# Patient Record
Sex: Male | Born: 1980 | Race: Black or African American | Hispanic: No | Marital: Single | Smoking: Former smoker
Health system: Southern US, Community
[De-identification: ages and names within clinical notes are randomized; demographics above are authoritative.]

## PROBLEM LIST (undated history)

## (undated) DIAGNOSIS — F429 Obsessive-compulsive disorder, unspecified: Secondary | ICD-10-CM

## (undated) DIAGNOSIS — F1210 Cannabis abuse, uncomplicated: Secondary | ICD-10-CM

## (undated) DIAGNOSIS — F1510 Other stimulant abuse, uncomplicated: Secondary | ICD-10-CM

## (undated) DIAGNOSIS — F424 Excoriation (skin-picking) disorder: Secondary | ICD-10-CM

## (undated) DIAGNOSIS — E119 Type 2 diabetes mellitus without complications: Secondary | ICD-10-CM

## (undated) DIAGNOSIS — F1410 Cocaine abuse, uncomplicated: Secondary | ICD-10-CM

## (undated) DIAGNOSIS — I10 Essential (primary) hypertension: Secondary | ICD-10-CM

## (undated) DIAGNOSIS — L981 Factitial dermatitis: Secondary | ICD-10-CM

## (undated) HISTORY — PX: OTHER SURGICAL HISTORY: SHX169

---

## 2013-01-04 ENCOUNTER — Encounter (HOSPITAL_BASED_OUTPATIENT_CLINIC_OR_DEPARTMENT_OTHER): Payer: Self-pay | Admitting: *Deleted

## 2013-01-04 DIAGNOSIS — A6000 Herpesviral infection of urogenital system, unspecified: Secondary | ICD-10-CM | POA: Insufficient documentation

## 2013-01-04 DIAGNOSIS — S3120XA Unspecified open wound of penis, initial encounter: Secondary | ICD-10-CM | POA: Insufficient documentation

## 2013-01-04 DIAGNOSIS — W503XXA Accidental bite by another person, initial encounter: Secondary | ICD-10-CM | POA: Insufficient documentation

## 2013-01-04 DIAGNOSIS — Y939 Activity, unspecified: Secondary | ICD-10-CM | POA: Insufficient documentation

## 2013-01-04 DIAGNOSIS — Z87891 Personal history of nicotine dependence: Secondary | ICD-10-CM | POA: Insufficient documentation

## 2013-01-04 DIAGNOSIS — Y929 Unspecified place or not applicable: Secondary | ICD-10-CM | POA: Insufficient documentation

## 2013-01-04 DIAGNOSIS — L089 Local infection of the skin and subcutaneous tissue, unspecified: Secondary | ICD-10-CM | POA: Insufficient documentation
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
## 2013-01-05 ENCOUNTER — Encounter (HOSPITAL_BASED_OUTPATIENT_CLINIC_OR_DEPARTMENT_OTHER): Payer: Self-pay | Admitting: Emergency Medicine

## 2013-01-05 ENCOUNTER — Emergency Department (HOSPITAL_BASED_OUTPATIENT_CLINIC_OR_DEPARTMENT_OTHER)
Admission: EM | Admit: 2013-01-05 | Discharge: 2013-01-05 | Disposition: A | Discharge status: Home / Self Care | Payer: BC Managed Care – PPO | Attending: Emergency Medicine | Admitting: Emergency Medicine

## 2013-01-05 DIAGNOSIS — A6000 Herpesviral infection of urogenital system, unspecified: Secondary | ICD-10-CM

## 2013-01-05 DIAGNOSIS — W503XXA Accidental bite by another person, initial encounter: Secondary | ICD-10-CM

## 2013-01-05 DIAGNOSIS — L089 Local infection of the skin and subcutaneous tissue, unspecified: Secondary | ICD-10-CM

## 2013-01-05 MED ORDER — AMOXICILLIN-POT CLAVULANATE 875-125 MG PO TABS: 1.0000 | ORAL_TABLET | Freq: Two times a day (BID) | ORAL | Status: DC

## 2013-01-05 MED ORDER — OXYCODONE-ACETAMINOPHEN 5-325 MG PO TABS: 1.0000 | ORAL_TABLET | Freq: Four times a day (QID) | ORAL | Status: DC | PRN

## 2013-01-05 MED ORDER — AMOXICILLIN-POT CLAVULANATE 875-125 MG PO TABS
1.0000 | ORAL_TABLET | Freq: Once | ORAL | Status: AC
  Administered 2013-01-05: 1 via ORAL
  Filled 2013-01-05: qty 1

## 2013-01-05 MED ORDER — VALACYCLOVIR HCL 1 G PO TABS: 1000.0000 mg | ORAL_TABLET | Freq: Two times a day (BID) | ORAL | Status: AC
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
## 2015-01-15 ENCOUNTER — Encounter (HOSPITAL_BASED_OUTPATIENT_CLINIC_OR_DEPARTMENT_OTHER): Payer: Self-pay | Admitting: *Deleted

## 2015-01-15 ENCOUNTER — Emergency Department (HOSPITAL_BASED_OUTPATIENT_CLINIC_OR_DEPARTMENT_OTHER)
Admission: EM | Admit: 2015-01-15 | Discharge: 2015-01-15 | Disposition: A | Discharge status: Home / Self Care | Payer: Self-pay | Attending: Emergency Medicine | Admitting: Emergency Medicine

## 2015-01-15 DIAGNOSIS — R21 Rash and other nonspecific skin eruption: Secondary | ICD-10-CM | POA: Insufficient documentation

## 2015-01-15 DIAGNOSIS — Z87891 Personal history of nicotine dependence: Secondary | ICD-10-CM | POA: Insufficient documentation

## 2015-01-15 DIAGNOSIS — Z113 Encounter for screening for infections with a predominantly sexual mode of transmission: Secondary | ICD-10-CM | POA: Insufficient documentation

## 2015-01-15 DIAGNOSIS — Z711 Person with feared health complaint in whom no diagnosis is made: Secondary | ICD-10-CM

## 2015-01-15 DIAGNOSIS — Z792 Long term (current) use of antibiotics: Secondary | ICD-10-CM | POA: Insufficient documentation

## 2015-01-15 NOTE — ED Notes (Signed)
Pt states that he has a rash to groin x 3 months intermittently as well as discharge from penis

## 2015-01-15 NOTE — ED Provider Notes (Signed)
CSN: [id number]     Arrival date & time 01/15/15  0435 History   First MD Initiated Contact with Patient 01/15/15 [phone]     Chief Complaint  Patient presents with  . Penile Discharge  . Rash     (Consider location/radiation/quality/duration/timing/severity/associated sxs/prior Treatment) The history is provided by the patient.  Had a lesion on his penis several weeks ago.  No f/c/r.  No discharge denies testicle pain.  Occasional difficulty retracting the foreskin during intercourse.    History reviewed. No pertinent past medical history. Past Surgical History  Procedure Laterality Date  . Arm surgery     History reviewed. No pertinent family history. Social History  Substance Use Topics  . Smoking status: Former Games developer  . Smokeless tobacco: None  . Alcohol Use: No    Review of Systems  Genitourinary: Negative for frequency, hematuria and difficulty urinating.       Occasional difficulty retracting foreskin during intercourse and a lesion that resolved  All other systems reviewed and are negative.     Allergies  Hydrocodone  Home Medications   Prior to Admission medications   Medication Sig Start Date End Date Taking? Authorizing Provider  amoxicillin-clavulanate (AUGMENTIN) 875-125 MG per tablet Take 1 tablet by mouth 2 (two) times daily. One po bid x 7 days 01/05/13   Roston Grunewald, MD  oxyCODONE-acetaminophen (PERCOCET) 5-325 MG per tablet Take 1 tablet by mouth every 6 (six) hours as needed for pain. 01/05/13   Taressa Rauh, MD   BP 139/94 mmHg  Pulse 98  Temp(Src) 98.1 F (36.7 C) (Oral)  SpO2 100% Physical Exam  Constitutional: He is oriented to person, place, and time. He appears well-developed and well-nourished. No distress.  HENT:  Head: Normocephalic and atraumatic.  Eyes: Conjunctivae are normal. Pupils are equal, round, and reactive to light.  Neck: Normal range of motion. Neck supple.  Cardiovascular: Normal rate, regular rhythm and intact distal  pulses.   Pulmonary/Chest: Effort normal and breath sounds normal. No respiratory distress. He has no wheezes. He has no rales.  Abdominal: Soft. Bowel sounds are normal. There is no tenderness. There is no rebound and no guarding.  Genitourinary: Testes normal and penis normal. No phimosis, paraphimosis, hypospadias, penile erythema or penile tenderness. No discharge found.  Chaperone present  Musculoskeletal: Normal range of motion.  Neurological: He is alert and oriented to person, place, and time.  Skin: Skin is warm and dry.  Psychiatric: He has a normal mood and affect.    ED Course  Procedures (including critical care time) Labs Review Labs Reviewed - No data to display  Imaging Review No results found. I have personally reviewed and evaluated these images and lab results as part of my medical decision-making.   EKG Interpretation None      MDM   Final diagnoses:  None    There are no lesions at this time.  There is not now nor ever has been discharge.  Will refer to urology regarding the occasional issue with the foreskin as there is currently no issue.  Swabs sent.  You will be called only if positive.  Patient informed that we can only tell him what the lesion is if we visualize it.      Cy Blamer, MD 01/15/15 [phone]

## 2015-01-17 LAB — GC/CHLAMYDIA PROBE AMP (~~LOC~~) NOT AT ARMC
CHLAMYDIA, DNA PROBE: NEGATIVE
Neisseria Gonorrhea: NEGATIVE

## 2015-09-02 ENCOUNTER — Inpatient Hospital Stay (HOSPITAL_BASED_OUTPATIENT_CLINIC_OR_DEPARTMENT_OTHER)
Admission: EM | Admit: 2015-09-02 | Discharge: 2015-09-06 | DRG: 571 | Disposition: A | Discharge status: Home / Self Care | Payer: Self-pay | Attending: Internal Medicine | Admitting: Internal Medicine

## 2015-09-02 ENCOUNTER — Encounter (HOSPITAL_BASED_OUTPATIENT_CLINIC_OR_DEPARTMENT_OTHER): Payer: Self-pay | Admitting: *Deleted

## 2015-09-02 DIAGNOSIS — E119 Type 2 diabetes mellitus without complications: Secondary | ICD-10-CM

## 2015-09-02 DIAGNOSIS — L03113 Cellulitis of right upper limb: Secondary | ICD-10-CM | POA: Diagnosis present

## 2015-09-02 DIAGNOSIS — E1165 Type 2 diabetes mellitus with hyperglycemia: Secondary | ICD-10-CM | POA: Diagnosis present

## 2015-09-02 DIAGNOSIS — F1510 Other stimulant abuse, uncomplicated: Secondary | ICD-10-CM | POA: Diagnosis present

## 2015-09-02 DIAGNOSIS — L02413 Cutaneous abscess of right upper limb: Principal | ICD-10-CM | POA: Diagnosis present

## 2015-09-02 DIAGNOSIS — F1210 Cannabis abuse, uncomplicated: Secondary | ICD-10-CM | POA: Diagnosis present

## 2015-09-02 DIAGNOSIS — F429 Obsessive-compulsive disorder, unspecified: Secondary | ICD-10-CM | POA: Diagnosis present

## 2015-09-02 DIAGNOSIS — K5900 Constipation, unspecified: Secondary | ICD-10-CM | POA: Diagnosis present

## 2015-09-02 DIAGNOSIS — E860 Dehydration: Secondary | ICD-10-CM | POA: Diagnosis present

## 2015-09-02 DIAGNOSIS — Z87891 Personal history of nicotine dependence: Secondary | ICD-10-CM

## 2015-09-02 DIAGNOSIS — F1410 Cocaine abuse, uncomplicated: Secondary | ICD-10-CM | POA: Diagnosis present

## 2015-09-02 DIAGNOSIS — B950 Streptococcus, group A, as the cause of diseases classified elsewhere: Secondary | ICD-10-CM | POA: Diagnosis present

## 2015-09-02 DIAGNOSIS — Z6837 Body mass index (BMI) 37.0-37.9, adult: Secondary | ICD-10-CM

## 2015-09-02 DIAGNOSIS — I10 Essential (primary) hypertension: Secondary | ICD-10-CM | POA: Diagnosis present

## 2015-09-02 DIAGNOSIS — R739 Hyperglycemia, unspecified: Secondary | ICD-10-CM

## 2015-09-02 DIAGNOSIS — E876 Hypokalemia: Secondary | ICD-10-CM | POA: Diagnosis present

## 2015-09-02 DIAGNOSIS — R6510 Systemic inflammatory response syndrome (SIRS) of non-infectious origin without acute organ dysfunction: Secondary | ICD-10-CM | POA: Diagnosis present

## 2015-09-02 HISTORY — DX: Other stimulant abuse, uncomplicated: F15.10

## 2015-09-02 HISTORY — DX: Obsessive-compulsive disorder, unspecified: F42.9

## 2015-09-02 HISTORY — DX: Cannabis abuse, uncomplicated: F12.10

## 2015-09-02 HISTORY — DX: Essential (primary) hypertension: I10

## 2015-09-02 HISTORY — DX: Type 2 diabetes mellitus without complications: E11.9

## 2015-09-02 HISTORY — DX: Excoriation (skin-picking) disorder: F42.4

## 2015-09-02 HISTORY — DX: Cocaine abuse, uncomplicated: F14.10

## 2015-09-02 HISTORY — DX: Factitial dermatitis: L98.1

## 2015-09-02 LAB — CBC WITH DIFFERENTIAL/PLATELET
BASOS ABS: 0 10*3/uL (ref 0.0–0.1)
BASOS PCT: 0 %
EOS PCT: 1 %
Eosinophils Absolute: 0.1 10*3/uL (ref 0.0–0.7)
HCT: 38.4 % — ABNORMAL LOW (ref 39.0–52.0)
Hemoglobin: 13.7 g/dL (ref 13.0–17.0)
Lymphocytes Relative: 18 %
Lymphs Abs: 2.3 10*3/uL (ref 0.7–4.0)
MCH: 29.6 pg (ref 26.0–34.0)
MCHC: 35.7 g/dL (ref 30.0–36.0)
MCV: 82.9 fL (ref 78.0–100.0)
MONO ABS: 1.4 10*3/uL — AB (ref 0.1–1.0)
MONOS PCT: 11 %
Neutro Abs: 9.2 10*3/uL — ABNORMAL HIGH (ref 1.7–7.7)
Neutrophils Relative %: 70 %
PLATELETS: 216 10*3/uL (ref 150–400)
RBC: 4.63 MIL/uL (ref 4.22–5.81)
RDW: 13.1 % (ref 11.5–15.5)
WBC: 13 10*3/uL — ABNORMAL HIGH (ref 4.0–10.5)

## 2015-09-02 LAB — BASIC METABOLIC PANEL
ANION GAP: 7 (ref 5–15)
BUN: 5 mg/dL — ABNORMAL LOW (ref 6–20)
CALCIUM: 8.6 mg/dL — AB (ref 8.9–10.3)
CO2: 26 mmol/L (ref 22–32)
CREATININE: 0.73 mg/dL (ref 0.61–1.24)
Chloride: 96 mmol/L — ABNORMAL LOW (ref 101–111)
GLUCOSE: 208 mg/dL — AB (ref 65–99)
Potassium: 3.3 mmol/L — ABNORMAL LOW (ref 3.5–5.1)
Sodium: 129 mmol/L — ABNORMAL LOW (ref 135–145)

## 2015-09-02 LAB — I-STAT CG4 LACTIC ACID, ED: Lactic Acid, Venous: 1.36 mmol/L (ref 0.5–2.0)

## 2015-09-02 MED ORDER — VANCOMYCIN HCL 10 G IV SOLR
1250.0000 mg | Freq: Three times a day (TID) | INTRAVENOUS | Status: DC
  Administered 2015-09-03 – 2015-09-05 (×7): 1250 mg via INTRAVENOUS
  Filled 2015-09-02 (×10): qty 1250

## 2015-09-02 MED ORDER — VANCOMYCIN HCL IN DEXTROSE 1-5 GM/200ML-% IV SOLN
1000.0000 mg | INTRAVENOUS | Status: AC
  Administered 2015-09-02: 1000 mg via INTRAVENOUS
  Filled 2015-09-02: qty 200

## 2015-09-02 MED ORDER — LIDOCAINE-EPINEPHRINE 1 %-1:100000 IJ SOLN
INTRAMUSCULAR | Status: AC
  Administered 2015-09-02: 1 mL
  Filled 2015-09-02: qty 1

## 2015-09-02 MED ORDER — PIPERACILLIN-TAZOBACTAM 3.375 G IVPB
3.3750 g | Freq: Three times a day (TID) | INTRAVENOUS | Status: DC
  Administered 2015-09-03 – 2015-09-05 (×7): 3.375 g via INTRAVENOUS
  Filled 2015-09-02 (×9): qty 50

## 2015-09-02 MED ORDER — SODIUM CHLORIDE 0.9 % IV BOLUS (SEPSIS)
1000.0000 mL | Freq: Once | INTRAVENOUS | Status: AC
  Administered 2015-09-02: 1000 mL via INTRAVENOUS

## 2015-09-02 MED ORDER — PIPERACILLIN-TAZOBACTAM 3.375 G IVPB
3.3750 g | Freq: Once | INTRAVENOUS | Status: AC
  Administered 2015-09-02: 3.375 g via INTRAVENOUS
  Filled 2015-09-02: qty 50

## 2015-09-02 MED ORDER — VANCOMYCIN HCL IN DEXTROSE 1-5 GM/200ML-% IV SOLN
1000.0000 mg | Freq: Once | INTRAVENOUS | Status: DC
  Filled 2015-09-02: qty 200

## 2015-09-02 NOTE — ED Provider Notes (Signed)
CSN: [id number]     Arrival date & time 09/02/15  1715 History   First MD Initiated Contact with Patient 09/02/15 1741     Chief Complaint  Patient presents with  . Abscess     (Consider location/radiation/quality/duration/timing/severity/associated sxs/prior Treatment) HPI Comments: Patient with history of diabetes, currently not taking medication for DM, not checking blood sugars -- presents with two-day history of redness and swelling to his right forearm. Patient has a long-standing habit of picking at his skin. He also did see spiders recently and is concerned that one may have bitten him, however he did not feel a bite. He has had a couple inflamed abscesses elsewhere on L temple and abdomen. No documented fevers, N/V. The onset of this condition was acute. The course is worsening. Aggravating factors: palpation. Alleviating factors: none.    Patient is a 35 y.o. male presenting with abscess. The history is provided by the patient.  Abscess Associated symptoms: no fever, no headaches, no nausea and no vomiting     Past Medical History  Diagnosis Date  . Hypertension    Past Surgical History  Procedure Laterality Date  . Arm surgery     History reviewed. No pertinent family history. Social History  Substance Use Topics  . Smoking status: Former Games developermoker  . Smokeless tobacco: None  . Alcohol Use: No    Review of Systems  Constitutional: Negative for fever.  HENT: Negative for rhinorrhea and sore throat.   Eyes: Negative for redness.  Respiratory: Negative for cough.   Cardiovascular: Negative for chest pain.  Gastrointestinal: Negative for nausea, vomiting, abdominal pain and diarrhea.  Genitourinary: Negative for dysuria.  Musculoskeletal: Negative for myalgias.  Skin: Positive for color change and wound. Negative for rash.       Positive for abscess  Neurological: Negative for headaches.  Hematological: Negative for adenopathy.    Allergies  Hydrocodone  Home  Medications   Prior to Admission medications   Not on File   BP 151/90 mmHg  Pulse 121  Temp(Src) 99.1 F (37.3 C) (Oral)  Resp 16  Ht 6\' 3"  (1.905 m)  Wt 136.079 kg  BMI 37.50 kg/m2  SpO2 100%   Physical Exam  Constitutional: He appears well-developed and well-nourished.  HENT:  Head: Normocephalic and atraumatic.  Eyes: Conjunctivae are normal. Right eye exhibits no discharge. Left eye exhibits no discharge.  Neck: Normal range of motion. Neck supple.  Cardiovascular: Regular rhythm and normal heart sounds.  Tachycardia present.   Pulses:      Radial pulses are 2+ on the right side, and 2+ on the left side.  Pulmonary/Chest: Effort normal and breath sounds normal.  Abdominal: Soft. There is no tenderness.  Neurological: He is alert.  Skin: Skin is warm and dry.  8-10cm diameter abscess to the R forearm with extensive cellulitis of the R forearm with slight extension to the R upper arm. Patient with extensive healed lesions. Full ROM of motion of hand. Distal sensation intact.   Psychiatric: He has a normal mood and affect.  Nursing note and vitals reviewed.               ED Course  Procedures (including critical care time) Labs Review Labs Reviewed  CBC WITH DIFFERENTIAL/PLATELET - Abnormal; Notable for the following:    WBC 13.0 (*)    HCT 38.4 (*)    Neutro Abs 9.2 (*)    Monocytes Absolute 1.4 (*)    All other components within normal limits  BASIC METABOLIC PANEL - Abnormal; Notable for the following:    Sodium 129 (*)    Potassium 3.3 (*)    Chloride 96 (*)    Glucose, Bld 208 (*)    BUN 5 (*)    Calcium 8.6 (*)    All other components within normal limits  I-STAT CG4 LACTIC ACID, ED    Patient seen and examined. Discussed with Dr. Ranae Palms. Work-up initiated. Medications ordered.   Vital signs reviewed and are as follows: BP 151/90 mmHg  Pulse 121  Temp(Src) 99.1 F (37.3 C) (Oral)  Resp 16  Ht  (1.905 m)  Wt 136.079 kg  BMI  37.50 kg/m2  SpO2 100%  7:34 PM Dr. Ranae Palms has seen and performed limited I&D and has sent wound culture. Will speak with hand surgery for consult and likely admit to medicine.   9:03 PM Dr. Melvyn Novas consulted by myself and Dr. Ranae Palms. He will consult on patient in morning.   Spoke with Dr. Madelyn Flavors of [facility] who accepts patient for admission.    MDM   Final diagnoses:  Abscess of forearm, right  Cellulitis of right forearm  Hyperglycemia   Admit.     Renne Crigler, PA-C 09/02/15 2105  Loren Racer, MD 09/06/15 Marlyne Beards

## 2015-09-02 NOTE — Consult Note (Signed)
CASE DISCUSSED AND FILMS REVIEWED OVER PHONE PT HAD BEDSIDE INCISION AND DRAINAGE PER DR. Ranae PalmsYELVERTON ABOUT 50CC PURULENCE DRAINED PT NEEDS TO BE ADMITTED FOR IV ABX AND MAY NEED MORE FORMAL INCISION AND DRAINAGE PT IS TO BE ADMITTED TO  FOR IV ABX AND I WILL CONSULT ON PATIENT IN AM ABOUT MORE FORMAL INCISON AND DRAINAGE. KEEP NPO AT MIDNIGHT FOR POSSIBLE SURGERY IN AM I AM AWARE OF PATIENT AND DO NOT NEED TO BE CALLED WHEN PATIENT ARRIVES THE PATIENT WILL BE ON OR SCHEDULE FOR TOMORROW AS A PREEMPTIVE MOVE

## 2015-09-02 NOTE — ED Notes (Addendum)
Pt c/o insect bite  x 5 days large  abscess to right forearm

## 2015-09-02 NOTE — Progress Notes (Signed)
[name] is a 35 year old male with history of diabetes mellitus type 2 uncontrolled; who presented with complaints of two-day history of redness and swelling of the right forearm. Suspected secondary to skin picking versus possible spider bite. Patient found to be tachycardic with temp of 99.36F, and all other vitals wnl. Lab work reveals a WBC of 13.0, sodium 129, potassium 3.3, glucose 208, lactic acid 1.36. Patient started on antibiotics vancomycin and Zosyn. Dr. Orlan Leavensrtman of hand consulted as abscess present. Recommend keeping NPO for I&D in am.

## 2015-09-02 NOTE — Progress Notes (Signed)
Pharmacy Antibiotic Note  [name] [name] is a 35 y.o. male admitted on 09/02/2015 with cellulitis.  Pharmacy has been consulted for vancomycin and zosyn dosing. TMax is 99.1 and WBC is elevated at 13. Lactic acid is 1.36 and Scr is WNL.   Plan: - Vancomycin 2gm (1gm + 1gm) x 1 then 1250mg  IV Q8H  - Zosyn 3.375gm IV Q8H (4 hr inf) - F/u renal fxn, C&S, clinical status and trough at SS  Height: 6\' 3"  (190.5 cm) Weight: 300 lb (136.079 kg) IBW/kg (Calculated) : 84.5  Temp (24hrs), Avg:99.1 F (37.3 C), Min:99.1 F (37.3 C), Max:99.1 F (37.3 C)   Recent Labs Lab 09/02/15 1859 09/02/15 1900  WBC  --  13.0*  CREATININE  --  0.73  LATICACIDVEN 1.36  --     Estimated Creatinine Clearance: 189.8 mL/min (by C-G formula based on Cr of 0.73).    Allergies  Allergen Reactions  . Hydrocodone     Antimicrobials this admission: Vanc 4/28>> Zosyn 4/28>>  Dose adjustments this admission: N/A  Microbiology results: Pending  Thank you for allowing pharmacy to be a part of this patient's care.  [name] [name], [name] [name] 09/02/2015 8:08 PM

## 2015-09-03 ENCOUNTER — Inpatient Hospital Stay (HOSPITAL_COMMUNITY): Payer: Self-pay | Admitting: Certified Registered Nurse Anesthetist

## 2015-09-03 ENCOUNTER — Encounter (HOSPITAL_COMMUNITY): Payer: Self-pay

## 2015-09-03 ENCOUNTER — Encounter (HOSPITAL_COMMUNITY)
Admission: EM | Disposition: A | Discharge status: Home / Self Care | Payer: Self-pay | Source: Home / Self Care | Attending: Internal Medicine

## 2015-09-03 DIAGNOSIS — L02413 Cutaneous abscess of right upper limb: Secondary | ICD-10-CM | POA: Insufficient documentation

## 2015-09-03 DIAGNOSIS — I10 Essential (primary) hypertension: Secondary | ICD-10-CM

## 2015-09-03 DIAGNOSIS — F1210 Cannabis abuse, uncomplicated: Secondary | ICD-10-CM | POA: Diagnosis present

## 2015-09-03 DIAGNOSIS — E119 Type 2 diabetes mellitus without complications: Secondary | ICD-10-CM

## 2015-09-03 DIAGNOSIS — R6510 Systemic inflammatory response syndrome (SIRS) of non-infectious origin without acute organ dysfunction: Secondary | ICD-10-CM

## 2015-09-03 DIAGNOSIS — A419 Sepsis, unspecified organism: Secondary | ICD-10-CM | POA: Insufficient documentation

## 2015-09-03 DIAGNOSIS — F1510 Other stimulant abuse, uncomplicated: Secondary | ICD-10-CM | POA: Diagnosis present

## 2015-09-03 DIAGNOSIS — F1410 Cocaine abuse, uncomplicated: Secondary | ICD-10-CM | POA: Diagnosis present

## 2015-09-03 HISTORY — PX: I & D EXTREMITY: SHX5045

## 2015-09-03 LAB — CBC
HCT: 39.6 % (ref 39.0–52.0)
Hemoglobin: 13.4 g/dL (ref 13.0–17.0)
MCH: 29 pg (ref 26.0–34.0)
MCHC: 33.8 g/dL (ref 30.0–36.0)
MCV: 85.7 fL (ref 78.0–100.0)
PLATELETS: 205 10*3/uL (ref 150–400)
RBC: 4.62 MIL/uL (ref 4.22–5.81)
RDW: 13.4 % (ref 11.5–15.5)
WBC: 11.1 10*3/uL — AB (ref 4.0–10.5)

## 2015-09-03 LAB — URINALYSIS, ROUTINE W REFLEX MICROSCOPIC
BILIRUBIN URINE: NEGATIVE
GLUCOSE, UA: NEGATIVE mg/dL
Hgb urine dipstick: NEGATIVE
KETONES UR: NEGATIVE mg/dL
LEUKOCYTES UA: NEGATIVE
NITRITE: NEGATIVE
PROTEIN: NEGATIVE mg/dL
Specific Gravity, Urine: 1.008 (ref 1.005–1.030)
pH: 6.5 (ref 5.0–8.0)

## 2015-09-03 LAB — GLUCOSE, CAPILLARY
GLUCOSE-CAPILLARY: 129 mg/dL — AB (ref 65–99)
Glucose-Capillary: 113 mg/dL — ABNORMAL HIGH (ref 65–99)
Glucose-Capillary: 111 mg/dL — ABNORMAL HIGH (ref 65–99)
Glucose-Capillary: 129 mg/dL — ABNORMAL HIGH (ref 65–99)
Glucose-Capillary: 130 mg/dL — ABNORMAL HIGH (ref 65–99)
Glucose-Capillary: 126 mg/dL — ABNORMAL HIGH (ref 65–99)
Glucose-Capillary: 128 mg/dL — ABNORMAL HIGH (ref 65–99)

## 2015-09-03 LAB — HEPATIC FUNCTION PANEL
ALBUMIN: 3 g/dL — AB (ref 3.5–5.0)
ALK PHOS: 67 U/L (ref 38–126)
ALT: 21 U/L (ref 17–63)
AST: 23 U/L (ref 15–41)
Bilirubin, Direct: 0.2 mg/dL (ref 0.1–0.5)
Indirect Bilirubin: 0.8 mg/dL (ref 0.3–0.9)
TOTAL PROTEIN: 8 g/dL (ref 6.5–8.1)
Total Bilirubin: 1 mg/dL (ref 0.3–1.2)

## 2015-09-03 LAB — RAPID URINE DRUG SCREEN, HOSP PERFORMED
Amphetamines: POSITIVE — AB
Barbiturates: NOT DETECTED
Benzodiazepines: NOT DETECTED
COCAINE: POSITIVE — AB
Opiates: NOT DETECTED
TETRAHYDROCANNABINOL: POSITIVE — AB

## 2015-09-03 LAB — BASIC METABOLIC PANEL
ANION GAP: 10 (ref 5–15)
BUN: <5 mg/dL — ABNORMAL LOW (ref 6–20)
CALCIUM: 8.7 mg/dL — AB (ref 8.9–10.3)
CO2: 26 mmol/L (ref 22–32)
Chloride: 102 mmol/L (ref 101–111)
Creatinine, Ser: 0.68 mg/dL (ref 0.61–1.24)
Glucose, Bld: 110 mg/dL — ABNORMAL HIGH (ref 65–99)
Potassium: 3.9 mmol/L (ref 3.5–5.1)
Sodium: 138 mmol/L (ref 135–145)

## 2015-09-03 LAB — MRSA PCR SCREENING: MRSA by PCR: POSITIVE — AB

## 2015-09-03 SURGERY — IRRIGATION AND DEBRIDEMENT EXTREMITY
Anesthesia: Choice | Laterality: Right

## 2015-09-03 MED ORDER — CHLORHEXIDINE GLUCONATE 4 % EX LIQD
60.0000 mL | Freq: Once | CUTANEOUS | Status: AC
  Administered 2015-09-03: 4 via TOPICAL

## 2015-09-03 MED ORDER — ONDANSETRON HCL 4 MG PO TABS: 4.0000 mg | ORAL_TABLET | Freq: Four times a day (QID) | ORAL | Status: DC | PRN

## 2015-09-03 MED ORDER — POVIDONE-IODINE 10 % EX SWAB: 2.0000 "application " | Freq: Once | CUTANEOUS | Status: DC

## 2015-09-03 MED ORDER — CHLORHEXIDINE GLUCONATE CLOTH 2 % EX PADS
6.0000 | MEDICATED_PAD | Freq: Every day | CUTANEOUS | Status: DC
  Administered 2015-09-03 – 2015-09-06 (×4): 6 via TOPICAL

## 2015-09-03 MED ORDER — ACETAMINOPHEN 650 MG RE SUPP: 650.0000 mg | Freq: Four times a day (QID) | RECTAL | Status: DC | PRN

## 2015-09-03 MED ORDER — ONDANSETRON HCL 4 MG/2ML IJ SOLN
INTRAMUSCULAR | Status: DC | PRN
  Administered 2015-09-03: 4 mg via INTRAVENOUS

## 2015-09-03 MED ORDER — OXYCODONE HCL 5 MG PO TABS
10.0000 mg | ORAL_TABLET | ORAL | Status: DC | PRN
  Administered 2015-09-03 – 2015-09-06 (×10): 10 mg via ORAL
  Filled 2015-09-03 (×11): qty 2

## 2015-09-03 MED ORDER — MUPIROCIN 2 % EX OINT
1.0000 "application " | TOPICAL_OINTMENT | Freq: Two times a day (BID) | CUTANEOUS | Status: DC
  Administered 2015-09-03 – 2015-09-06 (×6): 1 via NASAL
  Filled 2015-09-03: qty 22

## 2015-09-03 MED ORDER — INSULIN ASPART 100 UNIT/ML ~~LOC~~ SOLN: 0.0000 [IU] | SUBCUTANEOUS | Status: DC

## 2015-09-03 MED ORDER — DIPHENHYDRAMINE HCL 50 MG/ML IJ SOLN
INTRAMUSCULAR | Status: AC
  Filled 2015-09-03: qty 1

## 2015-09-03 MED ORDER — HYDRALAZINE HCL 20 MG/ML IJ SOLN: 20.0000 mg | Freq: Four times a day (QID) | INTRAMUSCULAR | Status: DC | PRN

## 2015-09-03 MED ORDER — LIDOCAINE HCL (CARDIAC) 20 MG/ML IV SOLN
INTRAVENOUS | Status: DC | PRN
  Administered 2015-09-03: 100 mg via INTRAVENOUS

## 2015-09-03 MED ORDER — PROPOFOL 10 MG/ML IV BOLUS
INTRAVENOUS | Status: AC
  Filled 2015-09-03: qty 20

## 2015-09-03 MED ORDER — SUCCINYLCHOLINE CHLORIDE 20 MG/ML IJ SOLN
INTRAMUSCULAR | Status: DC | PRN
  Administered 2015-09-03: 140 mg via INTRAVENOUS

## 2015-09-03 MED ORDER — FENTANYL CITRATE (PF) 250 MCG/5ML IJ SOLN
INTRAMUSCULAR | Status: AC
  Filled 2015-09-03: qty 5

## 2015-09-03 MED ORDER — PROPOFOL 10 MG/ML IV BOLUS
INTRAVENOUS | Status: DC | PRN
  Administered 2015-09-03: 150 mg via INTRAVENOUS
  Administered 2015-09-03: 50 mg via INTRAVENOUS

## 2015-09-03 MED ORDER — MIDAZOLAM HCL 5 MG/5ML IJ SOLN
INTRAMUSCULAR | Status: DC | PRN
  Administered 2015-09-03: 2 mg via INTRAVENOUS

## 2015-09-03 MED ORDER — ONDANSETRON HCL 4 MG/2ML IJ SOLN
INTRAMUSCULAR | Status: AC
  Filled 2015-09-03: qty 2

## 2015-09-03 MED ORDER — SODIUM CHLORIDE 0.9 % IV BOLUS (SEPSIS)
1000.0000 mL | Freq: Once | INTRAVENOUS | Status: AC
  Administered 2015-09-03: 1000 mL via INTRAVENOUS

## 2015-09-03 MED ORDER — ONDANSETRON HCL 4 MG/2ML IJ SOLN: 4.0000 mg | Freq: Once | INTRAMUSCULAR | Status: DC | PRN

## 2015-09-03 MED ORDER — POTASSIUM CHLORIDE CRYS ER 20 MEQ PO TBCR
40.0000 meq | EXTENDED_RELEASE_TABLET | Freq: Once | ORAL | Status: AC
  Administered 2015-09-03: 40 meq via ORAL
  Filled 2015-09-03: qty 2

## 2015-09-03 MED ORDER — ONDANSETRON HCL 4 MG/2ML IJ SOLN: 4.0000 mg | Freq: Four times a day (QID) | INTRAMUSCULAR | Status: DC | PRN

## 2015-09-03 MED ORDER — OXYCODONE HCL 5 MG PO TABS
5.0000 mg | ORAL_TABLET | ORAL | Status: DC | PRN
  Administered 2015-09-03: 5 mg via ORAL
  Filled 2015-09-03: qty 1

## 2015-09-03 MED ORDER — MIDAZOLAM HCL 2 MG/2ML IJ SOLN
INTRAMUSCULAR | Status: AC
  Filled 2015-09-03: qty 2

## 2015-09-03 MED ORDER — MEPERIDINE HCL 25 MG/ML IJ SOLN: 6.2500 mg | INTRAMUSCULAR | Status: DC | PRN

## 2015-09-03 MED ORDER — ACETAMINOPHEN 325 MG PO TABS
650.0000 mg | ORAL_TABLET | Freq: Four times a day (QID) | ORAL | Status: DC | PRN
Start: 2015-09-03 — End: 2015-09-06

## 2015-09-03 MED ORDER — HYDROMORPHONE HCL 1 MG/ML IJ SOLN: 0.2500 mg | INTRAMUSCULAR | Status: DC | PRN

## 2015-09-03 MED ORDER — SODIUM CHLORIDE 0.9 % IV SOLN
INTRAVENOUS | Status: DC
  Administered 2015-09-03: 1000 mL via INTRAVENOUS
  Administered 2015-09-03 – 2015-09-04 (×2): via INTRAVENOUS
  Administered 2015-09-04: 1000 mL via INTRAVENOUS
  Administered 2015-09-05 – 2015-09-06 (×3): via INTRAVENOUS

## 2015-09-03 MED ORDER — FENTANYL CITRATE (PF) 100 MCG/2ML IJ SOLN
INTRAMUSCULAR | Status: DC | PRN
  Administered 2015-09-03: 50 ug via INTRAVENOUS
  Administered 2015-09-03: 150 ug via INTRAVENOUS
  Administered 2015-09-03: 50 ug via INTRAVENOUS

## 2015-09-03 MED ORDER — SENNOSIDES-DOCUSATE SODIUM 8.6-50 MG PO TABS: 1.0000 | ORAL_TABLET | Freq: Every evening | ORAL | Status: DC | PRN

## 2015-09-03 MED ORDER — DIPHENHYDRAMINE HCL 50 MG/ML IJ SOLN
INTRAMUSCULAR | Status: DC | PRN
Start: 2015-09-03 — End: 2015-09-03
  Administered 2015-09-03: 25 mg via INTRAVENOUS

## 2015-09-03 MED ORDER — LACTATED RINGERS IV SOLN
INTRAVENOUS | Status: DC | PRN
  Administered 2015-09-03: 10:00:00 via INTRAVENOUS

## 2015-09-03 MED ORDER — SODIUM CHLORIDE 0.9 % IR SOLN
Status: DC | PRN
  Administered 2015-09-03: 3000 mL

## 2015-09-03 SURGICAL SUPPLY — 56 items
BANDAGE ACE 4X5 VEL STRL LF (GAUZE/BANDAGES/DRESSINGS) ×2 IMPLANT
BANDAGE ELASTIC 3 VELCRO ST LF (GAUZE/BANDAGES/DRESSINGS) IMPLANT
BANDAGE ELASTIC 4 VELCRO ST LF (GAUZE/BANDAGES/DRESSINGS) ×4 IMPLANT
BNDG COHESIVE 1X5 TAN STRL LF (GAUZE/BANDAGES/DRESSINGS) IMPLANT
BNDG CONFORM 2 STRL LF (GAUZE/BANDAGES/DRESSINGS) IMPLANT
BNDG ESMARK 4X9 LF (GAUZE/BANDAGES/DRESSINGS) ×2 IMPLANT
BNDG GAUZE ELAST 4 BULKY (GAUZE/BANDAGES/DRESSINGS) ×4 IMPLANT
CORDS BIPOLAR (ELECTRODE) ×2 IMPLANT
COVER SURGICAL LIGHT HANDLE (MISCELLANEOUS) ×2 IMPLANT
CUFF TOURNIQUET SINGLE 18IN (TOURNIQUET CUFF) ×2 IMPLANT
CUFF TOURNIQUET SINGLE 24IN (TOURNIQUET CUFF) IMPLANT
DRAIN PENROSE 1/4X12 LTX STRL (WOUND CARE) ×2 IMPLANT
DRAPE SURG 17X23 STRL (DRAPES) ×2 IMPLANT
DRSG ADAPTIC 3X8 NADH LF (GAUZE/BANDAGES/DRESSINGS) ×2 IMPLANT
ELECT REM PT RETURN 9FT ADLT (ELECTROSURGICAL)
ELECTRODE REM PT RTRN 9FT ADLT (ELECTROSURGICAL) IMPLANT
GAUZE SPONGE 4X4 12PLY STRL (GAUZE/BANDAGES/DRESSINGS) ×2 IMPLANT
GAUZE XEROFORM 1X8 LF (GAUZE/BANDAGES/DRESSINGS) IMPLANT
GAUZE XEROFORM 5X9 LF (GAUZE/BANDAGES/DRESSINGS) IMPLANT
GLOVE BIOGEL PI IND STRL 8.5 (GLOVE) ×1 IMPLANT
GLOVE BIOGEL PI INDICATOR 8.5 (GLOVE) ×1
GLOVE SURG ORTHO 8.0 STRL STRW (GLOVE) ×2 IMPLANT
GOWN STRL REUS W/ TWL LRG LVL3 (GOWN DISPOSABLE) ×3 IMPLANT
GOWN STRL REUS W/ TWL XL LVL3 (GOWN DISPOSABLE) ×1 IMPLANT
GOWN STRL REUS W/TWL LRG LVL3 (GOWN DISPOSABLE) ×3
GOWN STRL REUS W/TWL XL LVL3 (GOWN DISPOSABLE) ×1
HANDPIECE INTERPULSE COAX TIP (DISPOSABLE)
KIT BASIN OR (CUSTOM PROCEDURE TRAY) ×2 IMPLANT
KIT ROOM TURNOVER OR (KITS) ×2 IMPLANT
MANIFOLD NEPTUNE II (INSTRUMENTS) ×2 IMPLANT
NEEDLE HYPO 25GX1X1/2 BEV (NEEDLE) IMPLANT
NS IRRIG 1000ML POUR BTL (IV SOLUTION) ×2 IMPLANT
PACK ORTHO EXTREMITY (CUSTOM PROCEDURE TRAY) ×2 IMPLANT
PAD ARMBOARD 7.5X6 YLW CONV (MISCELLANEOUS) ×4 IMPLANT
PAD CAST 4YDX4 CTTN HI CHSV (CAST SUPPLIES) ×1 IMPLANT
PADDING CAST COTTON 4X4 STRL (CAST SUPPLIES) ×1
SET HNDPC FAN SPRY TIP SCT (DISPOSABLE) IMPLANT
SOAP 2 % CHG 4 OZ (WOUND CARE) ×2 IMPLANT
SPLINT FIBERGLASS 4X30 (CAST SUPPLIES) ×2 IMPLANT
SPONGE GAUZE 4X4 12PLY STER LF (GAUZE/BANDAGES/DRESSINGS) ×2 IMPLANT
SPONGE LAP 18X18 X RAY DECT (DISPOSABLE) IMPLANT
SPONGE LAP 4X18 X RAY DECT (DISPOSABLE) IMPLANT
SUCTION FRAZIER HANDLE 10FR (MISCELLANEOUS)
SUCTION TUBE FRAZIER 10FR DISP (MISCELLANEOUS) IMPLANT
SUT ETHILON 4 0 PS 2 18 (SUTURE) IMPLANT
SUT ETHILON 5 0 P 3 18 (SUTURE)
SUT NYLON ETHILON 5-0 P-3 1X18 (SUTURE) IMPLANT
SYR CONTROL 10ML LL (SYRINGE) IMPLANT
TOWEL OR 17X24 6PK STRL BLUE (TOWEL DISPOSABLE) ×2 IMPLANT
TOWEL OR 17X26 10 PK STRL BLUE (TOWEL DISPOSABLE) ×2 IMPLANT
TUBE ANAEROBIC SPECIMEN COL (MISCELLANEOUS) IMPLANT
TUBE CONNECTING 12X1/4 (SUCTIONS) ×2 IMPLANT
TUBING CYSTO DISP (UROLOGICAL SUPPLIES) ×2 IMPLANT
UNDERPAD 30X30 INCONTINENT (UNDERPADS AND DIAPERS) ×2 IMPLANT
WATER STERILE IRR 1000ML POUR (IV SOLUTION) IMPLANT
YANKAUER SUCT BULB TIP NO VENT (SUCTIONS) ×2 IMPLANT

## 2015-09-03 NOTE — Progress Notes (Signed)
Notified AC of conversation [name] [name] and myself had about patient using cocaine. AC went to Short Stay to speak with patient, but he was already in surgery. She will follow up with him to get permission to do a belongings search. Per [employer], 2 present, plus myself did a room search, but left patient belongings alone until [facility] permission to [facility] his belongings. Madelin RearLonnie Hobart Marte, MSN, RN, [employer]RN

## 2015-09-03 NOTE — Anesthesia Preprocedure Evaluation (Addendum)
Anesthesia Evaluation  Patient identified by MRN, date of birth, ID band Patient awake  General Assessment Comment: Per pt Cocaine use @ 0830 this am  Reviewed: Allergy & Precautions, NPO status , Patient's Chart, lab work & pertinent test results  History of Anesthesia Complications Negative for: history of anesthetic complications  Airway Mallampati: II  TM Distance: >3 FB Neck ROM: Full    Dental  (+) Dental Advisory Given   Pulmonary former smoker,    Pulmonary exam normal breath sounds clear to auscultation       Cardiovascular hypertension, Pt. on medications Normal cardiovascular exam Rhythm:Regular Rate:Tachycardia     Neuro/Psych PSYCHIATRIC DISORDERS OCD   GI/Hepatic (+)     substance abuse  cocaine use, marijuana use and methamphetamine use,   Endo/Other  diabetes, Poorly Controlled, Type 2Morbid obesity  Renal/GU      Musculoskeletal   Abdominal (+) + obese,  Abdomen: soft. Bowel sounds: normal.  Peds  Hematology   Anesthesia Other Findings  Compulsive skin picking    Per pt Cocaine use @ 0830 this am  Reproductive/Obstetrics                        BP Readings from Last 3 Encounters:  09/03/15 133/87  01/15/15 134/86  01/04/13 160/100   Lab Results  Component Value Date   WBC 11.1* 09/03/2015   HGB 13.4 09/03/2015   HCT 39.6 09/03/2015   MCV 85.7 09/03/2015   PLT 205 09/03/2015     Chemistry      Component Value Date/Time   NA 138 09/03/2015 0742   K 3.9 09/03/2015 0742   CL 102 09/03/2015 0742   CO2 26 09/03/2015 0742   BUN <5* 09/03/2015 0742   CREATININE 0.68 09/03/2015 0742      Component Value Date/Time   CALCIUM 8.7* 09/03/2015 0742   ALKPHOS 67 09/03/2015 0742   AST 23 09/03/2015 0742   ALT 21 09/03/2015 0742   BILITOT 1.0 09/03/2015 0742    No results found for: HGBA1C   Anesthesia Physical Anesthesia Plan  ASA: III  Anesthesia Plan:  General   Post-op Pain Management:    Induction: Intravenous, Rapid sequence and Cricoid pressure planned  Airway Management Planned: Oral ETT  Additional Equipment:   Intra-op Plan:   Post-operative Plan: Extubation in OR  Informed Consent: I have reviewed the patients History and Physical, chart, labs and discussed the procedure including the risks, benefits and alternatives for the proposed anesthesia with the patient or authorized representative who has indicated his/her understanding and acceptance.     Plan Discussed with: CRNA and Surgeon  Anesthesia Plan Comments:        Anesthesia Quick Evaluation

## 2015-09-03 NOTE — Brief Op Note (Signed)
09/02/2015 - 09/03/2015  10:46 AM  PATIENT:  [name] [name]  35 y.o. male  PRE-OPERATIVE DIAGNOSIS:  ABSCESS  POST-OPERATIVE DIAGNOSIS:  * No post-op diagnosis entered *  PROCEDURE:  Procedure(s): IRRIGATION AND DEBRIDEMENT FOREARM (Right)  SURGEON:  Surgeon(s) and Role:    * Bradly BienenstockFred Lenzi Marmo, MD - Primary  PHYSICIAN ASSISTANT:   ASSISTANTS: none   ANESTHESIA:   general  EBL:     BLOOD ADMINISTERED:none  DRAINS: none   LOCAL MEDICATIONS USED:  MARCAINE    and NONE  SPECIMEN:  No Specimen  DISPOSITION OF SPECIMEN:  N/A  COUNTS:  YES  TOURNIQUET:    DICTATION: .Other Dictation: Dictation Number [id number]  PLAN OF CARE: Discharge to home after PACU  PATIENT DISPOSITION:  PACU - hemodynamically stable.   Delay start of Pharmacological VTE agent (>24hrs) due to surgical blood loss or risk of bleeding: not applicable

## 2015-09-03 NOTE — Progress Notes (Signed)
Pt seen, admitted earlier this am, s/p I&D for arm abscess this am was extremely rude and screaming at staff, due to the request per Staff to search his room due to suspicion of using cocaine in the room, told RN in PACU that he used cocaine 2hours prior to I&D. Continue IV Abx, IVF, FU Blood and Wound cultures  Zannie CovePreetha Chanita Boden, MD

## 2015-09-03 NOTE — Op Note (Signed)
NAMEdmonia James:  [name], [name]               ACCOUNT NO.:  [id number]  MEDICAL RECORD NO.:  [id number]  LOCATION:  [id number]                        FACILITY:  [facility]  PHYSICIAN:  Sharma CovertFred W. Ebb Carelock IV, M.D.DATE OF BIRTH:  1980-11-09  DATE OF PROCEDURE:  09/03/2015 DATE OF DISCHARGE:                              OPERATIVE REPORT   PREOPERATIVE DIAGNOSES: 1. Right forearm abscess. 2. IV drug user.  POSTOPERATIVE DIAGNOSES: 1. Right forearm abscess. 2. IV drug user.  ATTENDING SURGEON:  Sharma CovertFred W. Lenzy Kerschner, M.D., who scrubbed and present for the entire procedure.  ASSISTANT SURGEON:  None.  ANESTHESIA:  General via endotracheal tube.  PROCEDURE: 1. Incision and drainage of deep right forearm abscess. 2. Right forearm cephalic vein resection and ligation. 3. Excisional debridement of skin and subcutaneous tissue and     devitalized necrotic soft tissue.  SURGICAL INDICATIONS:  [name] is a right-hand dominant gentleman, who was injecting drugs last week, presented to the emergency department with worsening infection to the right arm.  [facility]  Admitted to the hospitalist service and recommended to undergo the above procedure.  The patient used cocaine 2 hours prior to his surgery, continuously he was an active drug user.  Risks, benefits, and alternatives were discussed in detail with the patient.  Signed informed consent was obtained.  DESCRIPTION OF PROCEDURE:  The patient was properly identified in the preoperative holding area, marked with a permanent marker on the right forearm to indicate the correct operative site.  The patient was brought back to the operating room, placed supine on anesthesia room table. General anesthesia was administered.  The patient was receiving IV antibiotics.  A well-padded tourniquet was placed on the right brachium and sealed with 1000 drape.  Right upper extremity was then prepped and draped in  normal sterile fashion.  Time-out was called, correct side was identified, and procedure begun.  Attention then turned to the right forearm.  Curvilinear incision was made directly over the anterior antecubital region, extended distally.  Dissection was carried down through skin and subcutaneous tissue, down to the fascial layer, where there was gross purulence.  An excisional debridement of skin and subcutaneous tissue was then done, extended down to the fascial layer. The fascial layer looked pretty good.  There did not appear to be any myonecrosis within the muscle.  After incision and drainage, excisional debridement was then carried out sharply with knife, scissors, and rongeur.  It appeared that the patient was injecting the meth, the vein was then ligated and tied off with silk ties.  Copious wound irrigation done throughout and wound cultures were taken.  After excisional debridement of wound and drainage, the wound was loosely reapproximated and closed over Penrose drains.  Adaptic dressing and sterile compressive bandage were then applied.  The patient was placed in a long- arm splint, extubated, and taken to recovery room in good condition.  POSTPROCEDURAL PLAN:  The patient will be admitted back to the Internal Medicine Service.  He is going to look at his wound within tomorrow, take the drain out, and the patient may require further repeat I and D's depending on his wound  healing.  Continue with the IV antibiotics still wound cultures come back.  He will need assistance as he has returned back to using drugs intravenously.     Madelynn Done, M.D.     FWO/MEDQ  D:  09/03/2015  T:  09/03/2015  Job:  [id number]

## 2015-09-03 NOTE — Anesthesia Postprocedure Evaluation (Signed)
Anesthesia Post Note  Patient: [name] [name]  Procedure(s) Performed: Procedure(s) (LRB): IRRIGATION AND DEBRIDEMENT FOREARM (Right)  Patient location during evaluation: PACU Anesthesia Type: General Level of consciousness: awake and alert Pain management: pain level controlled Vital Signs Assessment: post-procedure vital signs reviewed and stable Respiratory status: spontaneous breathing, nonlabored ventilation, respiratory function stable and patient connected to nasal cannula oxygen Cardiovascular status: blood pressure returned to baseline and stable Postop Assessment: no signs of nausea or vomiting Anesthetic complications: no    Last Vitals:  Filed Vitals:   09/03/15 1144 09/03/15 1215  BP: 140/98 159/94  Pulse: 97 98  Temp:    Resp: 20 20    Last Pain:  Filed Vitals:   09/03/15 1220  PainSc: Asleep                 Cammie Faulstich DAVID

## 2015-09-03 NOTE — Progress Notes (Signed)
Patient going to OR for I&D on RFA. No acute distress noted at this time, no complaints. Nurse in short stay informed and report was given.

## 2015-09-03 NOTE — Transfer of Care (Signed)
Immediate Anesthesia Transfer of Care Note  Patient: [name] [name]  Procedure(s) Performed: Procedure(s): IRRIGATION AND DEBRIDEMENT FOREARM (Right)  Patient Location: PACU  Anesthesia Type:General  Level of Consciousness: sedated and patient cooperative  Airway & Oxygen Therapy: Patient Spontanous Breathing and Patient connected to nasal cannula oxygen  Post-op Assessment: Report given to RN and Post -op Vital signs reviewed and stable  Post vital signs: Reviewed and stable  Last Vitals:  Filed Vitals:   09/03/15 0005 09/03/15 0622  BP: 164/89 133/87  Pulse: 112 89  Temp: 36.8 C 36.9 C  Resp: 18 18    Last Pain:  Filed Vitals:   09/03/15 1110  PainSc: 4       Patients Stated Pain Goal: 0 (09/03/15 0800)  Complications: No apparent anesthesia complications

## 2015-09-03 NOTE — Anesthesia Procedure Notes (Signed)
Procedure Name: Intubation Date/Time: 09/03/2015 10:47 AM Performed by: Fabian NovemberSOLHEIM, [name] [name] Pre-anesthesia Checklist: Patient identified, Patient being monitored, Timeout performed, Emergency Drugs available and Suction available Patient Re-evaluated:Patient Re-evaluated prior to inductionOxygen Delivery Method: Circle System Utilized Preoxygenation: Pre-oxygenation with 100% oxygen Intubation Type: IV induction, Rapid sequence and Cricoid Pressure applied Laryngoscope Size: Miller and 3 Grade View: Grade II Tube type: Oral Tube size: 8.0 mm Number of attempts: 1 Airway Equipment and Method: Stylet Placement Confirmation: ETT inserted through vocal cords under direct vision,  positive ETCO2 and breath sounds checked- equal and bilateral Secured at: 23 cm Tube secured with: Tape Dental Injury: Teeth and Oropharynx as per pre-operative assessment

## 2015-09-03 NOTE — Progress Notes (Signed)
Patient back from OR. Alert and oriented, complaining of pain in right forearm (10/10). MD notified about his pain and pain medication was adjusted. Family at bedside. Compression wrap dressing on RFA intact, clean, dry, with tube drainage in place. Will continue to monitor.

## 2015-09-03 NOTE — H&P (Signed)
[facility] History and Physical  [name] Sniffric Babler UUV:[id number] DOB: 01/10/1981 DOA: 09/02/2015  PCP: No PCP Per Patient   Chief Complaint: Right forearm pain and swelling after suspected spider bite  HPI: [name] [name] is a 35 y.o. gentleman who admits to prior diagnoses of HTN and DM though he does not take any prescription drugs at this time.  He feels that he was in his baseline state of health until last weekend.  Since that time, he reports multiple spider bites to various areas on his body.  However, on Wednesday, he awakened to find and localized area of warmth, redness, and swelling (beyond what he had seen with prior lesions).  His girlfriend had some old clindamycin, previously prescribed to her, that the patient started taking.  By Friday morning, his arm was markedly increased in size, with increased pain and warmth.  He noted chills and sweats, but no documented fever.  He has had light-headedness but no syncope.  He has had nausea but no vomiting.  [facility] where he was diagnosed with a right forearm abscess.  The ED attending performed bedside I and D.  Orthopedic surgery was consulted and anticipates that the patient will go to the OR in the AM for I and D.  Review of Systems: 12 system reviewed and negative except as stated in the HPI.  Past Medical History  Diagnosis Date  . Hypertension   . Diabetes mellitus without complication (HCC)   . OCD (obsessive compulsive disorder)   . Compulsive skin picking    Past Surgical History  Procedure Laterality Date  . Arm surgery    Traumatic injury to bilateral upper extremities requiring surgical intervention.  Social History:  Social History   Social History Narrative  Remote tobacco use.  He denies EtOH or illicit drug use.  He is not married but he has a significant other.  No children.  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  Allergies  Allergen Reactions  . Hydrocodone      Family History  Problem Relation Age of Onset  . Diabetes Mother   . Hypertension Mother   . Diabetes Father   . Heart attack Father     Prior to Admission medications   Not on File  NONE.  Physical Exam: Filed Vitals:   09/02/15 2100 09/02/15 2130 09/02/15 2200 09/03/15 0005  BP: 139/88 130/98 139/80 164/89  Pulse: 110 110 110 112  Temp:    98.2 F (36.8 C)  TempSrc:    Oral  Resp:    18  Height:    6\' 3"  (1.905 m)  Weight:    135.535 kg (298 lb 12.8 oz)  SpO2: 99% 100% 100% 100%     General:  Awake and alert.  Oriented to person, place, time and situation.  NAD.  Nontoxic appearing.  Head: [place]/AT  ENT: Mucous membranes are moist.  No nasal drainage.   Cardiovascular: Tachycardic but regular.  No significant pitting edema in lower extremities.  Respiratory: CTA bilaterally.  GI: Abdomen is soft/NT/ND.  Bowel sounds are present.  No guarding.  Skin: Multiple hyperpigmented scabs covering all four extremities and his torso.  There is large area of induration, erythema, and warmth to his right forearm, just distal to his elbow joint.  There is no active drainage at this time.  Musculoskeletal: Moves all four extremities spontaneously.  Psychiatric: Normal affect.  Neurologic: No focal deficits.   Labs on Admission:  Basic Metabolic Panel:  Recent Labs Lab 09/02/15 1900  NA 129*  K 3.3*  CL 96*  CO2 26  GLUCOSE 208*  BUN 5*  CREATININE 0.73  CALCIUM 8.6*   CBC:  Recent Labs Lab 09/02/15 1900  WBC 13.0*  NEUTROABS 9.2*  HGB 13.7  HCT 38.4*  MCV 82.9  PLT 216    Assessment/Plan Principal Problem:   Abscess of forearm, right Active Problems:   HTN (hypertension)   DM (diabetes mellitus) (HCC)   SIRS (systemic inflammatory response syndrome) (HCC)   Admit to med surg  SIRS secondary to right forearm abscess, lactic acid normal but patient has tachycardia, mild leukocytosis, and evidence of dehydration --Ortho consult pending (Dr.  Melvyn Novas).  NPO now for OR in the morning for I and D. --IV fluid resuscitation with NS --Blood cultures --U/A and UDS --Empiric vanc and zosyn per pharmacy  HTN --PRN IV hydralazine for now; will need to be discharged on oral anti-hypertensive  DM --Check A1c --Check blood sugar q4h while NPO --Low dose SSI since he is naive to insulin --Will need education before discharge and oral regimen for home  Mild hypokalemia noted; replacement ordered.  Needs referral to a PCP at discharge.  Code Status: FULL Family Communication: Patient alone at time of admission Disposition Plan: Expect the patient to be here at least two midnights  Time spent: 40 minutes  [employer] [facility]  09/03/2015, 1:46 AM

## 2015-09-03 NOTE — Progress Notes (Signed)
Shireen Quanobert Todd called me and stated the patient had stated that the patient had used cociane two hours prior to going down for surgery. Please review his note for 1038. Madelin RearLonnie Rylan Bernard, MSN, RN, [employer] EnergyCMSRN

## 2015-09-03 NOTE — Progress Notes (Signed)
Received patient to floor, via stretcher, alert and oriented[facility] due to right upper arm abcess, which he states he received from a spider bite. Denies seeing spider.  Alert and oriented, patient is aware that he may have surgery tomorrow and is to have nothing by mouth.

## 2015-09-03 NOTE — Progress Notes (Addendum)
Patient came down and reported that he done cocaine "2 hours ago when in the bathroom and threw the rest of it away." Dr. Michelle Piperssey and Dr. Melvyn Novasrtmann notified by CRNA. Consulting civil engineerCharge RN on 5W also notified

## 2015-09-03 NOTE — Progress Notes (Signed)
Patient returned from PACU, Dr Jomarie LongsJoseph spoke with patient. AC also spoke with patient and understands previous conversation. He now agrees to belongings search. 2 security officers, myself and [facility] during room [facility]. Nothing was noted to be found. Patient may stay at this time. Will continue to monitor. Madelin RearLonnie Pierce Biagini, MSN, RN, [employer]CMSRN

## 2015-09-03 NOTE — Progress Notes (Addendum)
Patient reported that he was allergic to all fish and shellfish. When I went to determine reaction to document in chart he denied allergy and denied telling me this information. Patient was being transported back to OR and I requested to OR give nasal betadine. Patient noted to be alert and oriented but intermittently heavily sleeping

## 2015-09-03 NOTE — Progress Notes (Signed)
AC called me back and stated patient refused belongings search. Greta RN notified primary physician. Patient aware he can not stay per hospital policy unless he sends his belongings home or allows security to search, [facility]. Will notify [facility] when patient returns to room. Madelin RearLonnie Brittay Mogle, MSN, RN, [employer]CMSRN

## 2015-09-03 NOTE — Consult Note (Signed)
PT AN IV DRUG USER WAS INJECTING DRUGS METH NOW WITH ABSCESS IN FOREARM HERE FOR INCISION AND DRAINAGE OF RIGHT FOREARM ABSCESS USED COCAINE WITHIN LAST TWO HOURS PLAN FOR RIGHT FOREARM INCISION AND DRAINAGE R/B/A DISCUSSED WITH PT IN HOLDING AREA  PT VOICED UNDERSTANDING OF PLAN CONSENT SIGNED DAY OF SURGERY PT SEEN AND EXAMINED PRIOR TO OPERATIVE PROCEDURE/DAY OF SURGERY SITE MARKED. QUESTIONS ANSWERED WILL REMAIN AN INPATIENT FOLLOWING SURGERY

## 2015-09-04 DIAGNOSIS — E0829 Diabetes mellitus due to underlying condition with other diabetic kidney complication: Secondary | ICD-10-CM

## 2015-09-04 DIAGNOSIS — R6510 Systemic inflammatory response syndrome (SIRS) of non-infectious origin without acute organ dysfunction: Secondary | ICD-10-CM

## 2015-09-04 DIAGNOSIS — L03113 Cellulitis of right upper limb: Secondary | ICD-10-CM | POA: Insufficient documentation

## 2015-09-04 DIAGNOSIS — F1510 Other stimulant abuse, uncomplicated: Secondary | ICD-10-CM

## 2015-09-04 LAB — GLUCOSE, CAPILLARY
GLUCOSE-CAPILLARY: 124 mg/dL — AB (ref 65–99)
GLUCOSE-CAPILLARY: 143 mg/dL — AB (ref 65–99)
GLUCOSE-CAPILLARY: 86 mg/dL (ref 65–99)
Glucose-Capillary: 142 mg/dL — ABNORMAL HIGH (ref 65–99)
Glucose-Capillary: 152 mg/dL — ABNORMAL HIGH (ref 65–99)

## 2015-09-04 MED ORDER — SACCHAROMYCES BOULARDII 250 MG PO CAPS
250.0000 mg | ORAL_CAPSULE | Freq: Two times a day (BID) | ORAL | Status: DC
  Administered 2015-09-04 – 2015-09-06 (×4): 250 mg via ORAL
  Filled 2015-09-04 (×4): qty 1

## 2015-09-04 MED ORDER — ENOXAPARIN SODIUM 40 MG/0.4ML ~~LOC~~ SOLN
40.0000 mg | SUBCUTANEOUS | Status: DC
  Filled 2015-09-04 (×2): qty 0.4

## 2015-09-04 MED ORDER — NICOTINE 14 MG/24HR TD PT24
14.0000 mg | MEDICATED_PATCH | Freq: Every day | TRANSDERMAL | Status: DC
  Administered 2015-09-04 – 2015-09-06 (×3): 14 mg via TRANSDERMAL
  Filled 2015-09-04 (×3): qty 1

## 2015-09-04 NOTE — Progress Notes (Signed)
[facility] PROGRESS NOTE  [name] [name] AVW:[id number] DOB: 12-Mar-1981 DOA: 09/02/2015   PCP: No PCP Per Patient     Assessment/Plan: Principal Problem:   Abscess of forearm, right Active Problems:   HTN (hypertension)   DM (diabetes mellitus) (HCC)   SIRS (systemic inflammatory response syndrome) (HCC)   Amphetamine abuse   Marijuana abuse   Cocaine abuse   Cellulitis of right forearm   35 y.o. gentleman who admits to prior diagnoses of HTN and DM though he does not take any prescription drugs at this time. He feels that he was in his baseline state of health until last weekend. Since that time, he reports multiple spider bites to various areas on his body. However, on Wednesday, he awakened to find and localized area of warmth, redness, and swelling (beyond what he had seen with prior lesions). His girlfriend had some old clindamycin, previously prescribed to her, that the patient started taking. He presented to the ED in [facility] where he was diagnosed with a left forearm abscess. The ED attending performed bedside I and D. Orthopedic surgery was consulted and anticipates that the patient will go to the OR in the AM for I and D.   Assessment and plan SIRS secondary to right forearm abscess, lactic acid normal but patient has tachycardia, mild leukocytosis, and evidence of dehydration --Ortho consult pending (Dr. Melvyn Novas). Status post incision and drainage of the deep left forearm abscess on 4/29 --IV fluid resuscitation with NS --Blood cultures/ wound culture --U/A and UDS --Empiric vanc and zosyn per pharmacy  Polysubstance abuse UDS positive for cocaine, amphetamines, marijuana extremely rude and screaming at staff, due to the request per Staff to search his room due to suspicion of using cocaine in the room, told RN in PACU that he used cocaine 2hours prior to I&D  HTN --PRN IV hydralazine for now; will need to be discharged on oral  anti-hypertensive  DM Hemoglobin A1c pending Accu-Cheks 3 times a day --Low dose SSI since he is naive to insulin Currently on no medications at home  Mild hypokalemia noted; replacement ordered  DVT prophylaxsis  Lovenox  Code Status:  Full code    Family Communication: Discussed in detail with the patient/mother, all imaging results, lab results explained to the patient   Disposition Plan:  As per orthopedics, disposition depending on progress     Consultants:  None  Procedures:  None  Antibiotics: Vancomycin 4/28- Zosyn 4/28-       HPI/Subjective: Very unhappy with the providers taking care of him, would like therapy to evaluate his right upper extremity  Objective: Filed Vitals:   09/03/15 1215 09/03/15 1335 09/03/15 2233 09/04/15 0620  BP: 159/94 135/80 136/95 147/91  Pulse: 98 95 93 89  Temp:  98.5 F (36.9 C) 98.6 F (37 C) 98.6 F (37 C)  TempSrc:  Oral    Resp: Height:      Weight:      SpO2: 100% 100% 99% 100%    Intake/Output Summary (Last 24 hours) at 09/04/15 0843 Last data filed at 09/03/15 1815  Gross per 24 hour  Intake    620 ml  Output     50 ml  Net    570 ml    Exam:  Examination:  General exam: Appears calm and comfortable  Respiratory system: Clear to auscultation. Respiratory effort normal. Cardiovascular system: S1 & S2 heard, RRR. No JVD, murmurs, rubs, gallops or  clicks. No pedal edema. Gastrointestinal system: Abdomen is nondistended, soft and nontender. No organomegaly or masses felt. Normal bowel sounds heard. Central nervous system: Alert and oriented. No focal neurological deficits. Extremities: Symmetric 5 x 5 power. Skin: No rashes, lesions or ulcers Psychiatry: Judgement and insight appear normal. Mood & affect appropriate.     Data Reviewed: I have personally reviewed following labs and imaging studies  Micro Results Recent Results (from the past 240 hour(s))  Wound culture     Status:  None (Preliminary result)   Collection Time: 09/02/15 10:30 PM  Result Value Ref Range Status   Specimen Description ARM RIGHT  Final   Special Requests Normal  Final   Gram Stain PENDING  Incomplete   Culture   Final    Culture reincubated for better growth Performed at [facility]    Report Status PENDING  Incomplete  MRSA PCR Screening     Status: Abnormal   Collection Time: 09/03/15  3:05 AM  Result Value Ref Range Status   MRSA by PCR POSITIVE (A) NEGATIVE Final    Comment:        The GeneXpert MRSA Assay (FDA approved for NASAL specimens only), is one component of a comprehensive MRSA colonization surveillance program. It is not intended to diagnose MRSA infection nor to guide or monitor treatment for MRSA infections. RESULT CALLED TO, READ BACK BY AND VERIFIED WITH: A JETER,RN  09/03/15 [facility]     Radiology Reports No results found.   CBC  Recent Labs Lab 09/02/15 1900 09/03/15 0742  WBC 13.0* 11.1*  HGB 13.7 13.4  HCT 38.4* 39.6  PLT 216 205  MCV 82.9 85.7  MCH 29.6 29.0  MCHC 35.7 33.8  RDW 13.1 13.4  LYMPHSABS 2.3  --   MONOABS 1.4*  --   EOSABS 0.1  --   BASOSABS 0.0  --     Chemistries   Recent Labs Lab 09/02/15 1900 09/03/15 0742  NA 129* 138  K 3.3* 3.9  CL 96* 102  CO2 26 26  GLUCOSE 208* 110*  BUN 5* <5*  CREATININE 0.73 0.68  CALCIUM 8.6* 8.7*  AST  --  23  ALT  --  21  ALKPHOS  --  67  BILITOT  --  1.0   ------------------------------------------------------------------------------------------------------------------ estimated creatinine clearance is 189.4 mL/min (by C-G formula based on Cr of 0.68). ------------------------------------------------------------------------------------------------------------------ No results for input(s): HGBA1C in the last 72 hours. ------------------------------------------------------------------------------------------------------------------ No results for input(s): CHOL,  HDL, LDLCALC, TRIG, CHOLHDL, LDLDIRECT in the last 72 hours. ------------------------------------------------------------------------------------------------------------------ No results for input(s): TSH, T4TOTAL, T3FREE, THYROIDAB in the last 72 hours.  Invalid input(s): FREET3 ------------------------------------------------------------------------------------------------------------------ No results for input(s): VITAMINB12, FOLATE, FERRITIN, TIBC, IRON, RETICCTPCT in the last 72 hours.  Coagulation profile No results for input(s): INR, PROTIME in the last 168 hours.  No results for input(s): DDIMER in the last 72 hours.  Cardiac Enzymes No results for input(s): CKMB, TROPONINI, MYOGLOBIN in the last 168 hours.  Invalid input(s): CK ------------------------------------------------------------------------------------------------------------------ Invalid input(s): POCBNP   CBG:  Recent Labs Lab 09/03/15 1331 09/03/15 1755 09/03/15 2017 09/04/15 0047 09/04/15 0828  GLUCAP 130* 126* 128* 142* 124*       Studies: No results found.    No results found for: HGBA1C Lab Results  Component Value Date   CREATININE 0.68 09/03/2015       Scheduled Meds: . Chlorhexidine Gluconate Cloth  6 each Topical Q0600  . insulin aspart  0-9 Units Subcutaneous Q4H  .  mupirocin ointment  1 application Nasal BID  . piperacillin-tazobactam (ZOSYN)  IV  3.375 g Intravenous Q8H  . vancomycin  1,250 mg Intravenous Q8H  . vancomycin  1,000 mg Intravenous Once   Continuous Infusions: . sodium chloride 125 mL/hr at 09/04/15 0712     LOS: 2 days    Time spent: >30 MINS    Mar[facility] HospitalBROL,Yadiel Aubry  [facility] Pager [phone]. If 7PM-7AM, please contact night-coverage at [email or web address], password [email or web address] 09/04/2015, 8:43 AM  LOS: 2 days

## 2015-09-04 NOTE — Progress Notes (Signed)
Patient mother concerned about rash on the stomach, states the patient has eczema and uses clotrimazole cream at home.

## 2015-09-04 NOTE — Progress Notes (Signed)
On assessment, RN notified patient's right hand with edema (non-pitting). Encouraged patient to keep his hand elevated. Will continue to monitor.

## 2015-09-04 NOTE — Progress Notes (Signed)
PLAN FOR DRESSING CHANGE ON Monday CONTINUE WITH CURRENT SPLINT AND IV ABX WILL BE BY TO SEE PATIENT ON Monday TO CHANGE DRESSING I DO NOT KNOW WHAT TIME I WILL BE BY

## 2015-09-05 ENCOUNTER — Encounter (HOSPITAL_COMMUNITY): Payer: Self-pay | Admitting: Orthopedic Surgery

## 2015-09-05 DIAGNOSIS — L02413 Cutaneous abscess of right upper limb: Principal | ICD-10-CM

## 2015-09-05 DIAGNOSIS — E0800 Diabetes mellitus due to underlying condition with hyperosmolarity without nonketotic hyperglycemic-hyperosmolar coma (NKHHC): Secondary | ICD-10-CM

## 2015-09-05 DIAGNOSIS — B950 Streptococcus, group A, as the cause of diseases classified elsewhere: Secondary | ICD-10-CM

## 2015-09-05 DIAGNOSIS — F1410 Cocaine abuse, uncomplicated: Secondary | ICD-10-CM

## 2015-09-05 DIAGNOSIS — E1165 Type 2 diabetes mellitus with hyperglycemia: Secondary | ICD-10-CM

## 2015-09-05 LAB — CBC
HEMATOCRIT: 40.6 % (ref 39.0–52.0)
Hemoglobin: 14.1 g/dL (ref 13.0–17.0)
MCH: 29.5 pg (ref 26.0–34.0)
MCHC: 34.7 g/dL (ref 30.0–36.0)
MCV: 84.9 fL (ref 78.0–100.0)
PLATELETS: 284 10*3/uL (ref 150–400)
RBC: 4.78 MIL/uL (ref 4.22–5.81)
RDW: 13.4 % (ref 11.5–15.5)
WBC: 9.1 10*3/uL (ref 4.0–10.5)

## 2015-09-05 LAB — COMPREHENSIVE METABOLIC PANEL
ALBUMIN: 2.6 g/dL — AB (ref 3.5–5.0)
ALT: 17 U/L (ref 17–63)
AST: 29 U/L (ref 15–41)
Alkaline Phosphatase: 55 U/L (ref 38–126)
Anion gap: 11 (ref 5–15)
CO2: 22 mmol/L (ref 22–32)
CREATININE: 0.7 mg/dL (ref 0.61–1.24)
Calcium: 8.7 mg/dL — ABNORMAL LOW (ref 8.9–10.3)
Chloride: 108 mmol/L (ref 101–111)
GFR calc Af Amer: >60 mL/min (ref >60)
GFR calc non Af Amer: >60 mL/min (ref >60)
GLUCOSE: 113 mg/dL — AB (ref 65–99)
POTASSIUM: 6.1 mmol/L — AB (ref 3.5–5.1)
Sodium: 141 mmol/L (ref 135–145)
Total Bilirubin: 1.2 mg/dL (ref 0.3–1.2)
Total Protein: 7.2 g/dL (ref 6.5–8.1)

## 2015-09-05 LAB — GLUCOSE, CAPILLARY
GLUCOSE-CAPILLARY: 105 mg/dL — AB (ref 65–99)
GLUCOSE-CAPILLARY: 126 mg/dL — AB (ref 65–99)
GLUCOSE-CAPILLARY: 168 mg/dL — AB (ref 65–99)
GLUCOSE-CAPILLARY: 105 mg/dL — AB (ref 65–99)
Glucose-Capillary: 132 mg/dL — ABNORMAL HIGH (ref 65–99)
Glucose-Capillary: 145 mg/dL — ABNORMAL HIGH (ref 65–99)

## 2015-09-05 LAB — WOUND CULTURE: Special Requests: NORMAL

## 2015-09-05 LAB — HEMOGLOBIN A1C
Hgb A1c MFr Bld: 7.6 % — ABNORMAL HIGH (ref 4.8–5.6)
Mean Plasma Glucose: 171 mg/dL

## 2015-09-05 MED ORDER — SODIUM POLYSTYRENE SULFONATE 15 GM/60ML PO SUSP
15.0000 g | Freq: Once | ORAL | Status: AC
  Administered 2015-09-05: 15 g via ORAL
  Filled 2015-09-05: qty 60

## 2015-09-05 MED ORDER — POLYETHYLENE GLYCOL 3350 17 G PO PACK
17.0000 g | PACK | Freq: Two times a day (BID) | ORAL | Status: DC
  Administered 2015-09-05: 17 g via ORAL
  Filled 2015-09-05 (×2): qty 1

## 2015-09-05 MED ORDER — CEFTRIAXONE SODIUM 2 G IJ SOLR
2.0000 g | INTRAMUSCULAR | Status: DC
  Administered 2015-09-05 – 2015-09-06 (×2): 2 g via INTRAVENOUS
  Filled 2015-09-05 (×2): qty 2

## 2015-09-05 NOTE — Evaluation (Signed)
Occupational Therapy Evaluation and Discharge Patient Details Name: [name] [name] MRN: [id number] DOB: 1981/02/21 Today's Date: 09/05/2015    History of Present Illness Pt admitted with abscess of R forearm. PMH: HTN, DM, polysubstance abuse.   Clinical Impression   Pt was independent prior to admission. Self feeding with set up and use of L hand. Demonstrated ability to perform bathing and dressing with set up to min assist. Ambulated pushing IV pole at a modified independent level and toileted independently. Pt is capable of using his R UE within limitations of ace wrap. Educated pt in edema management techniques and elevated R UE on 3 pillows at end of session. No further OT needs.    Follow Up Recommendations  No OT follow up    Equipment Recommendations  None recommended by OT    Recommendations for Other Services       Precautions / Restrictions        Mobility Bed Mobility Overal bed mobility: Modified Independent             General bed mobility comments: HOB up, no assistance  Transfers Overall transfer level: Modified independent Equipment used:  (IV pole)                  Balance                                            ADL Overall ADL's : Needs assistance/impaired Eating/Feeding: Set up;Sitting Eating/Feeding Details (indicate cue type and reason): uses L UE Grooming: Wash/dry hands;Wash/dry face;Sitting;Set up   Upper Body Bathing: Minimal assitance;Sitting Upper Body Bathing Details (indicate cue type and reason): assisted with back Lower Body Bathing: Set up;Sit to/from stand   Upper Body Dressing : Set up;Sitting   Lower Body Dressing: Set up;Sit to/from stand   Toilet Transfer: Modified Independent;Ambulation;Comfort height toilet (pushed IV pole)   Toileting- Clothing Manipulation and Hygiene: Modified independent;Sit to/from stand       Functional mobility during ADLs: Modified independent (pushing IV  pole) General ADL Comments: Pt performed sponge bathing and dressing seated at sink.     Vision     Perception     Praxis      Pertinent Vitals/Pain Pain Assessment: 0-10 Pain Score: 7  Pain Location: R LE Pain Intervention(s): Monitored during session;Limited activity within patient's tolerance     Hand Dominance Right   Extremity/Trunk Assessment Upper Extremity Assessment Upper Extremity Assessment: RUE deficits/detail RUE Deficits / Details: ace wrap from wrist to proximal of elbow, mild hand edema, full shoulder and hand AROM RUE: Unable to fully assess due to immobilization;Unable to fully assess due to pain   Lower Extremity Assessment Lower Extremity Assessment: Overall WFL for tasks assessed   Cervical / Trunk Assessment Cervical / Trunk Assessment: Normal   Communication Communication Communication: No difficulties   Cognition Arousal/Alertness: Awake/alert Behavior During Therapy: Agitated;WFL for tasks assessed/performed Overall Cognitive Status: Within Functional Limits for tasks assessed                     General Comments       Exercises       Shoulder Instructions      Home Living Family/patient expects to be discharged to:: Private residence Living Arrangements: Spouse/significant other (girlfriend) Available Help at Discharge: Family;Available PRN/intermittently Type of Home: House Home Access: Stairs to enter [employer]  of Steps: 5 Entrance Stairs-Rails: Right Home Layout: One level     Bathroom Shower/Tub: Chief Strategy OfficerTub/shower unit   Bathroom Toilet: Standard     Home Equipment: None          Prior Functioning/Environment Level of Independence: Independent             OT Diagnosis: Generalized weakness;Acute pain   OT Problem List:     OT Treatment/Interventions:      OT Goals(Current goals can be found in the care plan section) Acute Rehab OT Goals Patient Stated Goal: to learn from the doctor what the  plan is  OT Frequency:     Barriers to D/C:            Co-evaluation              End of Session Nurse Communication:  (IV complete, aware of pain level)  Activity Tolerance: Patient tolerated treatment well Patient left: in bed;with call bell/phone within reach;with family/visitor present   Time: [id number] OT Time Calculation (min): 45 min Charges:  OT General Charges $OT Visit: 1 Procedure OT Evaluation $OT Eval Low Complexity: 1 Procedure OT Treatments $Self Care/Home Management : 23-37 mins G-Codes:    Evern BioMayberry, [name] Lynn 09/05/2015, 2:43 PM  [phone]

## 2015-09-05 NOTE — Progress Notes (Signed)
CRITICAL VALUE ALERT  Critical value received:  K+ 6.2  Date of notification:  5/1  Time of notification:  0930  Critical value read back:yes  Nurse who received alert:  Janell QuietKayla Price  MD notified (1st page):  Abrol  Time of first page:  0935  MD notified (2nd page):  Time of second page:  Responding MD:  Susie CassetteAbrol  Time MD responded:  [phone] (orders received)

## 2015-09-05 NOTE — Progress Notes (Signed)
[facility] PROGRESS NOTE  Kanin Lia ZOX:[id number] DOB: 03-09-1981 DOA: 09/02/2015   PCP: No PCP Per Patient     Assessment/Plan: Principal Problem:   Abscess of forearm, right Active Problems:   HTN (hypertension)   DM (diabetes mellitus) (HCC)   SIRS (systemic inflammatory response syndrome) (HCC)   Amphetamine abuse   Marijuana abuse   Cocaine abuse   Cellulitis of right forearm   35 y.o. gentleman who admits to prior diagnoses of HTN and DM though he does not take any prescription drugs at this time. He feels that he was in his baseline state of health until last weekend. Since that time, he reports multiple spider bites to various areas on his body. However, on Wednesday, he awakened to find and localized area of warmth, redness, and swelling (beyond what he had seen with prior lesions). His girlfriend had some old clindamycin, previously prescribed to her, that the patient started taking. He presented to the ED in [facility] where he was diagnosed with a left forearm abscess. The ED attending performed bedside I and D. Orthopedic surgery was consulted and anticipates that the patient will go to the OR in the AM for I and D.   Assessment and plan SIRS secondary to right forearm abscess, wound culture positive for group A strep pyogenes  lactic acid normal but presented with sirs criteria   tachycardia, mild leukocytosis, and evidence of dehydration --Oconsulted (Dr. Melvyn Novas). Status post incision and drainage of the deep left forearm abscess on 4/29 --IV fluid resuscitation with NS --Blood cultures no growth so far/ wound culture GROUP A STREP (S.PYOGENES --U/A and UDS positive,  Initially placed on vanc and zosynon 4/28, Now switched to Rocephin 2 g IV daily after telephone consultation with Dr. Ninetta Lights Dressing change today by Dr. Melvyn Novas   Polysubstance abuse- UDS positive for cocaine, amphetamines, marijuana, check HIV antibody extremely rude and  screaming at staff, due to the request per Staff to search his room due to suspicion of using cocaine in the room, told RN in PACU that he used cocaine 2hours prior to I&D  HTN --PRN IV hydralazine for now; will need to be discharged on oral anti-hypertensive  Constipation Start MiraLAX  DM Hemoglobin A1c pending Accu-Cheks 3 times a day --Low dose SSI since he is naive to insulin Currently on no medications at home  Mild hypokalemia noted; repleted, now potassium is 6.1, will recheck  DVT prophylaxsis  Lovenox  Code Status:  Full code    Family Communication: Discussed in detail with the patient/mother, all imaging results, lab results explained to the patient   Disposition Plan:  As per orthopedics, disposition depending on progress     Consultants:  None  Procedures:  None  Antibiotics: Vancomycin 4/28- 5/1 Zosyn 4/28-5/1       HPI/Subjective:  Somnolent, barely arousable according to the patient's mother who is by the bedside the patient is constipated   Objective: Filed Vitals:   09/04/15 0620 09/04/15 1608 09/04/15 2221 09/05/15 0402  BP: 147/91 153/97 132/78 129/69  Pulse: 89 80 91 95  Temp: 98.6 F (37 C) 98.1 F (36.7 C) 98.7 F (37.1 C) 98.2 F (36.8 C)  TempSrc:   Oral Oral  Resp: Height:      Weight:      SpO2: 100% 99% 98% 100%    Intake/Output Summary (Last 24 hours) at 09/05/15 0929 Last data filed at 09/05/15 8119  Gross  per 24 hour  Intake 3362.09 ml  Output   1000 ml  Net 2362.09 ml    Exam:  Examination:  General exam: Appears calm and comfortable  Respiratory system: Clear to auscultation. Respiratory effort normal. Cardiovascular system: S1 & S2 heard, RRR. No JVD, murmurs, rubs, gallops or clicks. No pedal edema. Gastrointestinal system: Abdomen is nondistended, soft and nontender. No organomegaly or masses felt. Normal bowel sounds heard. Central nervous system: Alert and oriented. No focal  neurological deficits. Extremities: Symmetric 5 x 5 power. Skin: No rashes, lesions or ulcers Psychiatry: Judgement and insight appear normal. Mood & affect appropriate.     Data Reviewed: I have personally reviewed following labs and imaging studies  Micro Results Recent Results (from the past 240 hour(s))  Wound culture     Status: None   Collection Time: 09/02/15 10:30 PM  Result Value Ref Range Status   Specimen Description ARM RIGHT  Final   Special Requests Normal  Final   Gram Stain   Final    FEW WBC PRESENT,BOTH PMN AND MONONUCLEAR RARE SQUAMOUS EPITHELIAL CELLS PRESENT FEW GRAM POSITIVE COCCI IN CHAINS Performed at [facility]    Culture   Final    MODERATE GROUP A STREP (S.PYOGENES) ISOLATED Note: Beta hemolytic streptococci are predictably susceptible to penicillin and other beta lactams. Susceptibility testing not routinely performed. Performed at [facility]    Report Status 09/05/2015 FINAL  Final  Culture, blood (routine x 2)     Status: None (Preliminary result)   Collection Time: 09/03/15  1:49 AM  Result Value Ref Range Status   Specimen Description BLOOD LEFT ARM  Final   Special Requests BOTTLES DRAWN AEROBIC AND ANAEROBIC 5ML  Final   Culture NO GROWTH 1 DAY  Final   Report Status PENDING  Incomplete  Culture, blood (routine x 2)     Status: None (Preliminary result)   Collection Time: 09/03/15  1:55 AM  Result Value Ref Range Status   Specimen Description BLOOD LEFT ARM  Final   Special Requests BOTTLES DRAWN AEROBIC AND ANAEROBIC 5ML  Final   Culture NO GROWTH 1 DAY  Final   Report Status PENDING  Incomplete  MRSA PCR Screening     Status: Abnormal   Collection Time: 09/03/15  3:05 AM  Result Value Ref Range Status   MRSA by PCR POSITIVE (A) NEGATIVE Final    Comment:        The GeneXpert MRSA Assay (FDA approved for NASAL specimens only), is one component of a comprehensive MRSA colonization surveillance program. It is  not intended to diagnose MRSA infection nor to guide or monitor treatment for MRSA infections. RESULT CALLED TO, READ BACK BY AND VERIFIED WITH: A JETER,RN @0604  09/03/15 MKELLY   Culture, routine-abscess     Status: None (Preliminary result)   Collection Time: 09/03/15 11:20 AM  Result Value Ref Range Status   Specimen Description ABSCESS RIGHT FOREARM  Final   Special Requests NONE  Final   Gram Stain   Final    NO WBC SEEN NO SQUAMOUS EPITHELIAL CELLS SEEN NO ORGANISMS SEEN Performed at [facility]    Culture   Final    FEW GROUP A STREP (S.PYOGENES) ISOLATED Note: Beta hemolytic streptococci are predictably susceptible to penicillin and other beta lactams. Susceptibility testing not routinely performed. Performed at [facility]    Report Status PENDING  Incomplete    Radiology Reports No results found.   CBC  Recent Labs Lab 09/02/15 1900 09/03/15 0742  WBC 13.0* 11.1*  HGB 13.7 13.4  HCT 38.4* 39.6  PLT 216 205  MCV 82.9 85.7  MCH 29.6 29.0  MCHC 35.7 33.8  RDW 13.1 13.4  LYMPHSABS 2.3  --   MONOABS 1.4*  --   EOSABS 0.1  --   BASOSABS 0.0  --     Chemistries   Recent Labs Lab 09/02/15 1900 09/03/15 0742 09/05/15 0739  NA 129* 138 141  K 3.3* 3.9 6.1*  CL 96* 102 108  CO2 26 26 22   GLUCOSE 208* 110* 113*  BUN 5* <5* <5*  CREATININE 0.73 0.68 0.70  CALCIUM 8.6* 8.7* 8.7*  AST  --  23 29  ALT  --  21 17  ALKPHOS  --  67 55  BILITOT  --  1.0 1.2   ------------------------------------------------------------------------------------------------------------------ estimated creatinine clearance is 189.4 mL/min (by C-G formula based on Cr of 0.7). ------------------------------------------------------------------------------------------------------------------ No results for input(s): HGBA1C in the last 72  hours. ------------------------------------------------------------------------------------------------------------------ No results for input(s): CHOL, HDL, LDLCALC, TRIG, CHOLHDL, LDLDIRECT in the last 72 hours. ------------------------------------------------------------------------------------------------------------------ No results for input(s): TSH, T4TOTAL, T3FREE, THYROIDAB in the last 72 hours.  Invalid input(s): FREET3 ------------------------------------------------------------------------------------------------------------------ No results for input(s): VITAMINB12, FOLATE, FERRITIN, TIBC, IRON, RETICCTPCT in the last 72 hours.  Coagulation profile No results for input(s): INR, PROTIME in the last 168 hours.  No results for input(s): DDIMER in the last 72 hours.  Cardiac Enzymes No results for input(s): CKMB, TROPONINI, MYOGLOBIN in the last 168 hours.  Invalid input(s): CK ------------------------------------------------------------------------------------------------------------------ Invalid input(s): POCBNP   CBG:  Recent Labs Lab 09/04/15 1658 09/04/15 2034 09/05/15 0035 09/05/15 0400 09/05/15 0822  GLUCAP 86 152* 132* 105* 126*       Studies: No results found.    No results found for: HGBA1C Lab Results  Component Value Date   CREATININE 0.70 09/05/2015       Scheduled Meds: . Chlorhexidine Gluconate Cloth  6 each Topical Q0600  . enoxaparin (LOVENOX) injection  40 mg Subcutaneous Q24H  . insulin aspart  0-9 Units Subcutaneous Q4H  . mupirocin ointment  1 application Nasal BID  . nicotine  14 mg Transdermal Daily  . piperacillin-tazobactam (ZOSYN)  IV  3.375 g Intravenous Q8H  . saccharomyces boulardii  250 mg Oral BID  . vancomycin  1,250 mg Intravenous Q8H  . vancomycin  1,000 mg Intravenous Once   Continuous Infusions: . sodium chloride 125 mL/hr at 09/05/15 0219     LOS: 3 days    Time spent: >30 MINS     [facility]  [facility] Pager [phone]. If 7PM-7AM, please contact night-coverage at [email or web address], password [facility] 09/05/2015, 9:29 AM  LOS: 3 days

## 2015-09-05 NOTE — Consult Note (Signed)
[name] for Infectious Disease  Date of Admission:  09/02/2015  Date of Consult:  09/05/2015  Reason for Consult: Abscess Referring Physician: Nada Maclachlan  Impression/Recommendation RUE abscess, Group A strep DM2 (A1C 7.6%) Cocaine use   Check HIV, Hep C, RPR, GC/chlamydia If he would like HIV "prevention medicine" we would be glad to see him in clinic.  Would send him home with augmentin for 3 weeks.  I would not be comfortable sending him home with ceftriaxone IV with his active drug use. Needs PCP for his DM2- I made it very clear to him that he will lose his vision and his limbs if he does not control this.  Drug education.  Available as needed.   Thank you so much for this interesting consult,   [name] (pager) [phone] [email or web address]  [name] [name] is an 35 y.o. male.  HPI: 35 yo M with hx of Dm2 (on no meds?), noted on 4-26 to have RUE erythema and swelling. He took clinda from his girlfriend but did not improve. He developed worsening swelling, chills and sweats and presented to ED on 4-29.  He was started on vanco/zosyn. He was taken to OR and underwent debridement of abscess.  His course was also complicated by reported use of cocaine in the hospital.  His abscess Cx has since grown group A strep. BCx are negative.   Past Medical History  Diagnosis Date  . Hypertension   . Diabetes mellitus without complication ([place])   . OCD (obsessive compulsive disorder)   . Compulsive skin picking   . Cocaine abuse   . Amphetamine abuse   . Marijuana abuse     Past Surgical History  Procedure Laterality Date  . Arm surgery    . I&d extremity Right 09/03/2015    Procedure: IRRIGATION AND DEBRIDEMENT FOREARM;  Surgeon: Iran Planas, MD;  Location: [facility];  Service: Orthopedics;  Laterality: Right;     Allergies  Allergen Reactions  . Demerol [Meperidine] Other (See Comments)    Makes him crazy  . Hydrocodone Other (See Comments)    Unknown  reaction    Medications:  Scheduled: . cefTRIAXone (ROCEPHIN)  IV  2 g Intravenous Q24H  . Chlorhexidine Gluconate Cloth  6 each Topical Q0600  . enoxaparin (LOVENOX) injection  40 mg Subcutaneous Q24H  . insulin aspart  0-9 Units Subcutaneous Q4H  . mupirocin ointment  1 application Nasal BID  . nicotine  14 mg Transdermal Daily  . polyethylene glycol  17 g Oral BID  . saccharomyces boulardii  250 mg Oral BID  . vancomycin  1,000 mg Intravenous Once    Abtx:  Anti-infectives    Start     Dose/Rate Route Frequency Ordered Stop   09/05/15 1000  cefTRIAXone (ROCEPHIN) 2 g in dextrose 5 % 50 mL IVPB     2 g 100 mL/hr over 30 Minutes Intravenous Every 24 hours 09/05/15 0932     09/03/15 0400  vancomycin (VANCOCIN) 1,250 mg in sodium chloride 0.9 % 250 mL IVPB  Status:  Discontinued     1,250 mg 166.7 mL/hr over 90 Minutes Intravenous Every 8 hours 09/02/15 2008 09/05/15 0932   09/03/15 0200  piperacillin-tazobactam (ZOSYN) IVPB 3.375 g  Status:  Discontinued     3.375 g 12.5 mL/hr over 240 Minutes Intravenous Every 8 hours 09/02/15 2008 09/05/15 0932   09/02/15 2015  vancomycin (VANCOCIN) IVPB 1000 mg/200 mL premix     1,000 mg 200 mL/hr over 60 Minutes  Intravenous  Once 09/02/15 1900     09/02/15 1915  piperacillin-tazobactam (ZOSYN) IVPB 3.375 g     3.375 g 100 mL/hr over 30 Minutes Intravenous  Once 09/02/15 1900 09/02/15 2208   09/02/15 1900  vancomycin (VANCOCIN) IVPB 1000 mg/200 mL premix     1,000 mg 200 mL/hr over 60 Minutes Intravenous Every hour 09/02/15 1900 09/02/15 2208      Total days of antibiotics: 2 vanco/zosyn --> ceftraixone          Social History:  reports that he has quit smoking. He does not have any smokeless tobacco history on file. He reports that he uses illicit drugs (Amphetamines, Cocaine, and Marijuana). He reports that he does not drink alcohol.  Family History  Problem Relation Age of Onset  . Diabetes Mother   . Hypertension Mother   .  Diabetes Father   . Heart attack Father     General ROS: he is worried he has AIDS. no dysphagia, no dysuria, +constipation, no proximal RUE erythema or swelling. see HPI.   Blood pressure 138/96, pulse 91, temperature 98.4 F (36.9 C), temperature source Oral, resp. rate 16, height 6' 3"  (1.905 m), weight 135.535 kg (298 lb 12.8 oz), SpO2 100 %. General appearance: alert, cooperative and no distress Eyes: negative findings: conjunctivae and sclerae normal and pupils equal, round, reactive to light and accomodation Throat: normal findings: oropharynx pink & moist without lesions or evidence of thrush Neck: no adenopathy and supple, symmetrical, trachea midline Lungs: clear to auscultation bilaterally Heart: regular rate and rhythm Abdomen: normal findings: bowel sounds normal and soft, non-tender Extremities: edema none Skin: RUE wrapped. normal light touch in fingers. no proximal streaking. multiple healed scars thoughout body.    Results for orders placed or performed during the hospital encounter of 09/02/15 (from the past 48 hour(s))  Glucose, capillary     Status: Abnormal   Collection Time: 09/03/15  8:17 PM  Result Value Ref Range   Glucose-Capillary 128 (H) 65 - 99 mg/dL   Comment 1 Notify RN    Comment 2 Document in Chart   Glucose, capillary     Status: Abnormal   Collection Time: 09/04/15 12:47 AM  Result Value Ref Range   Glucose-Capillary 142 (H) 65 - 99 mg/dL  Glucose, capillary     Status: Abnormal   Collection Time: 09/04/15  8:28 AM  Result Value Ref Range   Glucose-Capillary 124 (H) 65 - 99 mg/dL  Glucose, capillary     Status: Abnormal   Collection Time: 09/04/15 12:09 PM  Result Value Ref Range   Glucose-Capillary 143 (H) 65 - 99 mg/dL  Glucose, capillary     Status: None   Collection Time: 09/04/15  4:58 PM  Result Value Ref Range   Glucose-Capillary 86 65 - 99 mg/dL  Glucose, capillary     Status: Abnormal   Collection Time: 09/04/15  8:34 PM  Result  Value Ref Range   Glucose-Capillary 152 (H) 65 - 99 mg/dL   Comment 1 Notify RN    Comment 2 Document in Chart   Glucose, capillary     Status: Abnormal   Collection Time: 09/05/15 12:35 AM  Result Value Ref Range   Glucose-Capillary 132 (H) 65 - 99 mg/dL   Comment 1 Notify RN    Comment 2 Document in Chart   Glucose, capillary     Status: Abnormal   Collection Time: 09/05/15  4:00 AM  Result Value Ref Range   Glucose-Capillary 105 (  H) 65 - 99 mg/dL   Comment 1 Notify RN    Comment 2 Document in Chart   CBC     Status: None   Collection Time: 09/05/15  7:39 AM  Result Value Ref Range   WBC 9.1 4.0 - 10.5 K/uL    Comment: WHITE COUNT CONFIRMED ON SMEAR REPEATED TO VERIFY    RBC 4.78 4.22 - 5.81 MIL/uL   Hemoglobin 14.1 13.0 - 17.0 g/dL   HCT 40.6 39.0 - 52.0 %   MCV 84.9 78.0 - 100.0 fL   MCH 29.5 26.0 - 34.0 pg   MCHC 34.7 30.0 - 36.0 g/dL   RDW 13.4 11.5 - 15.5 %   Platelets 284 150 - 400 K/uL  Comprehensive metabolic panel     Status: Abnormal   Collection Time: 09/05/15  7:39 AM  Result Value Ref Range   Sodium 141 135 - 145 mmol/L   Potassium 6.1 (HH) 3.5 - 5.1 mmol/L    Comment: DELTA CHECK NOTED SLIGHT HEMOLYSIS CRITICAL RESULT CALLED TO, READ BACK BY AND VERIFIED WITH: K PRICE,RN [id number] WILDERK    Chloride 108 101 - 111 mmol/L   CO2 22 22 - 32 mmol/L   Glucose, Bld 113 (H) 65 - 99 mg/dL   BUN <5 (L) 6 - 20 mg/dL   Creatinine, Ser 0.70 0.61 - 1.24 mg/dL   Calcium 8.7 (L) 8.9 - 10.3 mg/dL   Total Protein 7.2 6.5 - 8.1 g/dL   Albumin 2.6 (L) 3.5 - 5.0 g/dL   AST 29 15 - 41 U/L   ALT 17 17 - 63 U/L   Alkaline Phosphatase 55 38 - 126 U/L   Total Bilirubin 1.2 0.3 - 1.2 mg/dL   GFR calc non Af Amer >60 >60 mL/min   GFR calc Af Amer >60 >60 mL/min    Comment: (NOTE) The eGFR has been calculated using the CKD EPI equation. This calculation has not been validated in all clinical situations. eGFR's persistently <60 mL/min signify possible Chronic  Kidney Disease.    Anion gap 11 5 - 15  Glucose, capillary     Status: Abnormal   Collection Time: 09/05/15  8:22 AM  Result Value Ref Range   Glucose-Capillary 126 (H) 65 - 99 mg/dL  Glucose, capillary     Status: Abnormal   Collection Time: 09/05/15 12:11 PM  Result Value Ref Range   Glucose-Capillary 168 (H) 65 - 99 mg/dL  Glucose, capillary     Status: Abnormal   Collection Time: 09/05/15  5:47 PM  Result Value Ref Range   Glucose-Capillary 105 (H) 65 - 99 mg/dL   Comment 1 Notify RN       Component Value Date/Time   SDES ABSCESS RIGHT FOREARM 09/03/2015 1120   SDES ABSCESS RIGHT FOREARM 09/03/2015 1120   SPECREQUEST NONE 09/03/2015 1120   SPECREQUEST NONE 09/03/2015 1120   CULT  09/03/2015 1120    NO ANAEROBES ISOLATED; CULTURE IN PROGRESS FOR 5 DAYS Performed at [facility]  09/03/2015 1120    FEW GROUP A STREP (S.PYOGENES) ISOLATED Note: Beta hemolytic streptococci are predictably susceptible to penicillin and other beta lactams. Susceptibility testing not routinely performed. Performed at [facility] PENDING 09/03/2015 1120   REPTSTATUS PENDING 09/03/2015 1120   No results found. Recent Results (from the past 240 hour(s))  Wound culture     Status: None   Collection Time: 09/02/15 10:30 PM  Result Value  Ref Range Status   Specimen Description ARM RIGHT  Final   Special Requests Normal  Final   Gram Stain   Final    FEW WBC PRESENT,BOTH PMN AND MONONUCLEAR RARE SQUAMOUS EPITHELIAL CELLS PRESENT FEW GRAM POSITIVE COCCI IN CHAINS Performed at [facility]    Culture   Final    MODERATE GROUP A STREP (S.PYOGENES) ISOLATED Note: Beta hemolytic streptococci are predictably susceptible to penicillin and other beta lactams. Susceptibility testing not routinely performed. Performed at [facility]    Report Status 09/05/2015 FINAL  Final  Culture, blood (routine x 2)     Status: None (Preliminary result)    Collection Time: 09/03/15  1:49 AM  Result Value Ref Range Status   Specimen Description BLOOD LEFT ARM  Final   Special Requests BOTTLES DRAWN AEROBIC AND ANAEROBIC 5ML  Final   Culture NO GROWTH 2 DAYS  Final   Report Status PENDING  Incomplete  Culture, blood (routine x 2)     Status: None (Preliminary result)   Collection Time: 09/03/15  1:55 AM  Result Value Ref Range Status   Specimen Description BLOOD LEFT ARM  Final   Special Requests BOTTLES DRAWN AEROBIC AND ANAEROBIC 5ML  Final   Culture NO GROWTH 2 DAYS  Final   Report Status PENDING  Incomplete  MRSA PCR Screening     Status: Abnormal   Collection Time: 09/03/15  3:05 AM  Result Value Ref Range Status   MRSA by PCR POSITIVE (A) NEGATIVE Final    Comment:        The GeneXpert MRSA Assay (FDA approved for NASAL specimens only), is one component of a comprehensive MRSA colonization surveillance program. It is not intended to diagnose MRSA infection nor to guide or monitor treatment for MRSA infections. RESULT CALLED TO, READ BACK BY AND VERIFIED WITH: A JETER,RN @0604  09/03/15 MKELLY   Anaerobic culture     Status: None (Preliminary result)   Collection Time: 09/03/15 11:20 AM  Result Value Ref Range Status   Specimen Description ABSCESS RIGHT FOREARM  Final   Special Requests NONE  Final   Gram Stain PENDING  Incomplete   Culture   Final    NO ANAEROBES ISOLATED; CULTURE IN PROGRESS FOR 5 DAYS Performed at [facility]    Report Status PENDING  Incomplete  Culture, routine-abscess     Status: None (Preliminary result)   Collection Time: 09/03/15 11:20 AM  Result Value Ref Range Status   Specimen Description ABSCESS RIGHT FOREARM  Final   Special Requests NONE  Final   Gram Stain   Final    NO WBC SEEN NO SQUAMOUS EPITHELIAL CELLS SEEN NO ORGANISMS SEEN Performed at [facility]    Culture   Final    FEW GROUP A STREP (S.PYOGENES) ISOLATED Note: Beta hemolytic streptococci are  predictably susceptible to penicillin and other beta lactams. Susceptibility testing not routinely performed. Performed at [facility]    Report Status PENDING  Incomplete      09/05/2015, 6:49 PM     LOS: 3 days    Records and images were personally reviewed where available.

## 2015-09-05 NOTE — Progress Notes (Signed)
PT SEEN/EXAMINED WOUND LOOKS BETTER NO GROSS PURULENCE FINGERS SWOLLEN, WELL PERFUSED DRESSING CHANGED DRAIN REMOVED OK TO GO HOME ON ORAL ABX PER CULTURE FINDINGS WILL NEED TO SEE ME IN OFFICE ON Friday FOR WOUND CHECK KEEP CURRENT SPLINT ON AT ALL TIMES NO USE OF RIGHT HAND, ARM

## 2015-09-05 NOTE — Discharge Instructions (Signed)
KEEP BANDAGE CLEAN AND DRY °CALL OFFICE FOR F/U APPT [phone] IN 4 DAYS °KEEP HAND ELEVATED ABOVE HEART °OK TO APPLY ICE TO OPERATIVE AREA °CONTACT OFFICE IF ANY WORSENING PAIN OR CONCERNS. °

## 2015-09-06 LAB — HIV ANTIBODY (ROUTINE TESTING W REFLEX): HIV Screen 4th Generation wRfx: NONREACTIVE

## 2015-09-06 LAB — GLUCOSE, CAPILLARY
Glucose-Capillary: 182 mg/dL — ABNORMAL HIGH (ref 65–99)
Glucose-Capillary: 140 mg/dL — ABNORMAL HIGH (ref 65–99)

## 2015-09-06 LAB — CULTURE, ROUTINE-ABSCESS: Gram Stain: NONE SEEN

## 2015-09-06 LAB — RPR: RPR Ser Ql: NONREACTIVE

## 2015-09-06 MED ORDER — OXYCODONE HCL 5 MG PO TABS: 5.0000 mg | ORAL_TABLET | Freq: Four times a day (QID) | ORAL | Status: DC | PRN

## 2015-09-06 MED ORDER — SACCHAROMYCES BOULARDII 250 MG PO CAPS: 250.0000 mg | ORAL_CAPSULE | Freq: Two times a day (BID) | ORAL | Status: DC

## 2015-09-06 MED ORDER — AMOXICILLIN-POT CLAVULANATE 875-125 MG PO TABS: 1.0000 | ORAL_TABLET | Freq: Two times a day (BID) | ORAL | Status: DC

## 2015-09-06 MED ORDER — GLIPIZIDE 5 MG PO TABS: 5.0000 mg | ORAL_TABLET | Freq: Every day | ORAL | Status: DC

## 2015-09-06 MED ORDER — NICOTINE 14 MG/24HR TD PT24: 14.0000 mg | MEDICATED_PATCH | Freq: Every day | TRANSDERMAL | Status: DC

## 2015-09-06 MED FILL — oxyCODONE HCL 5 MG TABS: 5 | 7 days supply | Qty: 30 | Fill #0

## 2015-09-06 MED FILL — glipiZIDE 5 MG TABS: 5 | 30 days supply | Qty: 30 | Fill #0

## 2015-09-06 MED FILL — AMOX-CLAV 875-125 MG TABLET: 875-125 | 21 days supply | Qty: 42 | Fill #0

## 2015-09-06 NOTE — Progress Notes (Signed)
Electa SniffEric Vlachos to be D/C'd to home per MD order.  Discussed with the patient and all questions fully answered.  VSS, Skin clean, dry and intact without evidence of skin break down, no evidence of skin tears noted. IV catheter discontinued intact. Site without signs and symptoms of complications. Dressing and pressure applied.  An After Visit Summary was printed and given to the patient. Patient received prescriptions.  D/c education completed with patient/family including follow up instructions, medication list, d/c activities limitations if indicated, with other d/c instructions as indicated by MD - patient able to verbalize understanding, all questions fully answered.   Patient instructed to return to ED, call 911, or call MD for any changes in condition.   Patient escorted via WC, and D/C home via private auto.  Joellyn HaffKayla L Price 09/06/2015 12:19 PM

## 2015-09-06 NOTE — Progress Notes (Signed)
Pt is refusing 0000 CBG check but agrees to have 0400 CBG check done. Will continue to monitor pt.

## 2015-09-06 NOTE — Discharge Summary (Signed)
Physician Discharge Summary  [name] [name] MRN: [id number] DOB/AGE: 09/08/1980 35 y.o.  PCP: No PCP Per Patient   Admit date: 09/02/2015 Discharge date: 09/06/2015  Discharge Diagnoses:   Principal Problem:   Abscess of forearm, right Active Problems:   HTN (hypertension)   DM (diabetes mellitus) (HCC)   SIRS (systemic inflammatory response syndrome) (HCC)   Amphetamine abuse   Marijuana abuse   Cocaine abuse   Cellulitis of right forearm    Follow-up recommendations Follow-up with PCP in 3-5 days , including all  additional recommended appointments as below Follow-up CBC, CMP in 3-5 days Patient instructed to follow-up with Dr.Fred Caralyn Guile, MD, on Friday for wound check       Discharge Medication List as of 09/06/2015 12:01 PM    START taking these medications   Details  amoxicillin-clavulanate (AUGMENTIN) 875-125 MG tablet Take 1 tablet by mouth 2 (two) times daily., Starting 09/06/2015, Until Discontinued, Print    glipiZIDE (GLUCOTROL) 5 MG tablet Take 1 tablet (5 mg total) by mouth daily before breakfast., Starting 09/06/2015, Until Discontinued, Print    nicotine (NICODERM CQ - DOSED IN MG/24 HOURS) 14 mg/24hr patch Place 1 patch (14 mg total) onto the skin daily., Starting 09/06/2015, Until Discontinued, Normal    oxyCODONE (OXY IR/ROXICODONE) 5 MG immediate release tablet Take 1 tablet (5 mg total) by mouth every 6 (six) hours as needed for moderate pain., Starting 09/06/2015, Until Discontinued, Print    saccharomyces boulardii (FLORASTOR) 250 MG capsule Take 1 capsule (250 mg total) by mouth 2 (two) times daily., Starting 09/06/2015, Until Discontinued, Normal      STOP taking these medications     naproxen sodium (ALEVE) 220 MG tablet          Discharge Condition: Stable    Discharge Instructions Get Medicines reviewed and adjusted: Please take all your medications with you for your next visit with your Primary MD  Please request your Primary MD to go over  all hospital tests and procedure/radiological results at the follow up, please ask your Primary MD to get all Hospital records sent to his/her office.  If you experience worsening of your admission symptoms, develop shortness of breath, life threatening emergency, suicidal or homicidal thoughts you must seek medical attention immediately by calling 911 or calling your MD immediately if symptoms less severe.  You must read complete instructions/literature along with all the possible adverse reactions/side effects for all the Medicines you take and that have been prescribed to you. Take any new Medicines after you have completely understood and accpet all the possible adverse reactions/side effects.   Do not drive when taking Pain medications.   Do not take more than prescribed Pain, Sleep and Anxiety Medications  Special Instructions: If you have smoked or chewed Tobacco in the last 2 yrs please stop smoking, stop any regular Alcohol and or any Recreational drug use.  Wear Seat belts while driving.  Please note  You were cared for by a hospitalist during your hospital stay. Once you are discharged, your primary care physician will handle any further medical issues. Please note that NO REFILLS for any discharge medications will be authorized once you are discharged, as it is imperative that you return to your primary care physician (or establish a relationship with a primary care physician if you do not have one) for your aftercare needs so that they can reassess your need for medications and monitor your lab values.  Discharge Instructions    Diet - low sodium  heart healthy    Complete by:  As directed      Increase activity slowly    Complete by:  As directed             Allergies  Allergen Reactions  . Demerol [Meperidine] Other (See Comments)    Makes him crazy  . Hydrocodone Other (See Comments)    Unknown reaction      Disposition: 01-Home or Self  Care   Consults: Orthopedics Telephone consultation with Dr. Alinda Dooms Weights   09/02/15 1727 09/03/15 0005  Weight: 136.079 kg (300 lb) 135.535 kg (298 lb 12.8 oz)     Microbiology: Recent Results (from the past 240 hour(s))  Wound culture     Status: None   Collection Time: 09/02/15 10:30 PM  Result Value Ref Range Status   Specimen Description ARM RIGHT  Final   Special Requests Normal  Final   Gram Stain   Final    FEW WBC PRESENT,BOTH PMN AND MONONUCLEAR RARE SQUAMOUS EPITHELIAL CELLS PRESENT FEW GRAM POSITIVE COCCI IN CHAINS Performed at [facility]    Culture   Final    MODERATE GROUP A STREP (S.PYOGENES) ISOLATED Note: Beta hemolytic streptococci are predictably susceptible to penicillin and other beta lactams. Susceptibility testing not routinely performed. Performed at [facility]    Report Status 09/05/2015 FINAL  Final  Culture, blood (routine x 2)     Status: None (Preliminary result)   Collection Time: 09/03/15  1:49 AM  Result Value Ref Range Status   Specimen Description BLOOD LEFT ARM  Final   Special Requests BOTTLES DRAWN AEROBIC AND ANAEROBIC 5ML  Final   Culture NO GROWTH 3 DAYS  Final   Report Status PENDING  Incomplete  Culture, blood (routine x 2)     Status: None (Preliminary result)   Collection Time: 09/03/15  1:55 AM  Result Value Ref Range Status   Specimen Description BLOOD LEFT ARM  Final   Special Requests BOTTLES DRAWN AEROBIC AND ANAEROBIC 5ML  Final   Culture NO GROWTH 3 DAYS  Final   Report Status PENDING  Incomplete  MRSA PCR Screening     Status: Abnormal   Collection Time: 09/03/15  3:05 AM  Result Value Ref Range Status   MRSA by PCR POSITIVE (A) NEGATIVE Final    Comment:        The GeneXpert MRSA Assay (FDA approved for NASAL specimens only), is one component of a comprehensive MRSA colonization surveillance program. It is not intended to diagnose MRSA infection nor to guide or monitor  treatment for MRSA infections. RESULT CALLED TO, READ BACK BY AND VERIFIED WITH: A JETER,RN @0604  09/03/15 MKELLY   Anaerobic culture     Status: None (Preliminary result)   Collection Time: 09/03/15 11:20 AM  Result Value Ref Range Status   Specimen Description ABSCESS RIGHT FOREARM  Final   Special Requests NONE  Final   Gram Stain PENDING  Incomplete   Culture   Final    NO ANAEROBES ISOLATED; CULTURE IN PROGRESS FOR 5 DAYS Performed at [facility]    Report Status PENDING  Incomplete  Culture, routine-abscess     Status: None   Collection Time: 09/03/15 11:20 AM  Result Value Ref Range Status   Specimen Description ABSCESS RIGHT FOREARM  Final   Special Requests NONE  Final   Gram Stain   Final    NO WBC SEEN NO SQUAMOUS EPITHELIAL CELLS SEEN NO  ORGANISMS SEEN Performed at [facility]    Culture   Final    FEW GROUP A STREP (S.PYOGENES) ISOLATED Note: Beta hemolytic streptococci are predictably susceptible to penicillin and other beta lactams. Susceptibility testing not routinely performed. Performed at [facility]    Report Status 09/06/2015 FINAL  Final       Blood Culture    Component Value Date/Time   SDES ABSCESS RIGHT FOREARM 09/03/2015 1120   SDES ABSCESS RIGHT FOREARM 09/03/2015 1120   SPECREQUEST NONE 09/03/2015 1120   SPECREQUEST NONE 09/03/2015 1120   CULT  09/03/2015 1120    NO ANAEROBES ISOLATED; CULTURE IN PROGRESS FOR 5 DAYS Performed at [facility]  09/03/2015 1120    FEW GROUP A STREP (S.PYOGENES) ISOLATED Note: Beta hemolytic streptococci are predictably susceptible to penicillin and other beta lactams. Susceptibility testing not routinely performed. Performed at [facility] PENDING 09/03/2015 1120   REPTSTATUS 09/06/2015 FINAL 09/03/2015 1120      Labs: Results for orders placed or performed during the hospital encounter of 09/02/15 (from the past 48 hour(s))   Glucose, capillary     Status: None   Collection Time: 09/04/15  4:58 PM  Result Value Ref Range   Glucose-Capillary 86 65 - 99 mg/dL  Glucose, capillary     Status: Abnormal   Collection Time: 09/04/15  8:34 PM  Result Value Ref Range   Glucose-Capillary 152 (H) 65 - 99 mg/dL   Comment 1 Notify RN    Comment 2 Document in Chart   Glucose, capillary     Status: Abnormal   Collection Time: 09/05/15 12:35 AM  Result Value Ref Range   Glucose-Capillary 132 (H) 65 - 99 mg/dL   Comment 1 Notify RN    Comment 2 Document in Chart   Glucose, capillary     Status: Abnormal   Collection Time: 09/05/15  4:00 AM  Result Value Ref Range   Glucose-Capillary 105 (H) 65 - 99 mg/dL   Comment 1 Notify RN    Comment 2 Document in Chart   CBC     Status: None   Collection Time: 09/05/15  7:39 AM  Result Value Ref Range   WBC 9.1 4.0 - 10.5 K/uL    Comment: WHITE COUNT CONFIRMED ON SMEAR REPEATED TO VERIFY    RBC 4.78 4.22 - 5.81 MIL/uL   Hemoglobin 14.1 13.0 - 17.0 g/dL   HCT 40.6 39.0 - 52.0 %   MCV 84.9 78.0 - 100.0 fL   MCH 29.5 26.0 - 34.0 pg   MCHC 34.7 30.0 - 36.0 g/dL   RDW 13.4 11.5 - 15.5 %   Platelets 284 150 - 400 K/uL  Comprehensive metabolic panel     Status: Abnormal   Collection Time: 09/05/15  7:39 AM  Result Value Ref Range   Sodium 141 135 - 145 mmol/L   Potassium 6.1 (HH) 3.5 - 5.1 mmol/L    Comment: DELTA CHECK NOTED SLIGHT HEMOLYSIS CRITICAL RESULT CALLED TO, READ BACK BY AND VERIFIED WITH: K PRICE,RN [id number] WILDERK    Chloride 108 101 - 111 mmol/L   CO2 22 22 - 32 mmol/L   Glucose, Bld 113 (H) 65 - 99 mg/dL   BUN <5 (L) 6 - 20 mg/dL   Creatinine, Ser 0.70 0.61 - 1.24 mg/dL   Calcium 8.7 (L) 8.9 - 10.3 mg/dL   Total Protein 7.2 6.5 - 8.1 g/dL   Albumin  2.6 (L) 3.5 - 5.0 g/dL   AST 29 15 - 41 U/L   ALT 17 17 - 63 U/L   Alkaline Phosphatase 55 38 - 126 U/L   Total Bilirubin 1.2 0.3 - 1.2 mg/dL   GFR calc non Af Amer >60 >60 mL/min   GFR calc Af Amer  >60 >60 mL/min    Comment: (NOTE) The eGFR has been calculated using the CKD EPI equation. This calculation has not been validated in all clinical situations. eGFR's persistently <60 mL/min signify possible Chronic Kidney Disease.    Anion gap 11 5 - 15  Glucose, capillary     Status: Abnormal   Collection Time: 09/05/15  8:22 AM  Result Value Ref Range   Glucose-Capillary 126 (H) 65 - 99 mg/dL  HIV antibody     Status: None   Collection Time: 09/05/15 10:10 AM  Result Value Ref Range   HIV Screen 4th Generation wRfx Non Reactive Non Reactive    Comment: (NOTE) Performed At: [facility] [place], [place] [id number] Lindon Romp MD LK:[id number]   Glucose, capillary     Status: Abnormal   Collection Time: 09/05/15 12:11 PM  Result Value Ref Range   Glucose-Capillary 168 (H) 65 - 99 mg/dL  Glucose, capillary     Status: Abnormal   Collection Time: 09/05/15  5:47 PM  Result Value Ref Range   Glucose-Capillary 105 (H) 65 - 99 mg/dL   Comment 1 Notify RN   Glucose, capillary     Status: Abnormal   Collection Time: 09/05/15  8:29 PM  Result Value Ref Range   Glucose-Capillary 145 (H) 65 - 99 mg/dL   Comment 1 Notify RN    Comment 2 Document in Chart   Glucose, capillary     Status: Abnormal   Collection Time: 09/06/15  4:38 AM  Result Value Ref Range   Glucose-Capillary 182 (H) 65 - 99 mg/dL   Comment 1 Notify RN    Comment 2 Document in Chart   Glucose, capillary     Status: Abnormal   Collection Time: 09/06/15  8:14 AM  Result Value Ref Range   Glucose-Capillary 140 (H) 65 - 99 mg/dL     Lipid Panel  No results found for: CHOL, TRIG, HDL, CHOLHDL, VLDL, LDLCALC, LDLDIRECT   Lab Results  Component Value Date   HGBA1C 7.6* 09/03/2015     Lab Results  Component Value Date   CREATININE 0.70 09/05/2015    35 y.o. gentleman who admits to prior diagnoses of HTN and DM though he does not take any prescription drugs at this time. He feels  that he was in his baseline state of health until last weekend. Since that time, he reports multiple spider bites to various areas on his body. However, on Wednesday, he awakened to find and localized area of warmth, redness, and swelling (beyond what he had seen with prior lesions). His girlfriend had some old clindamycin, previously prescribed to her, that the patient started taking. He presented to the ED in [facility] where he was diagnosed with a right forearm abscess. The ED attending performed bedside I and D. Orthopedic surgery was consulted and patient is status post irrigation and debridement on 4/29   Hospital course SIRS secondary to right forearm abscess, wound culture positive for group A strep pyogenes lactic acid normal but presented with sirs criteria tachycardia, mild leukocytosis, and evidence of dehydration --Oconsulted (Dr. Caralyn Guile). Status post incision and drainage of the  deep left forearm abscess on 4/29 Status post IV fluid resuscitation --Blood cultures no growth so far/ wound culture GROUP A STREP (S.PYOGENES --U/A negative and UDS positive, Initially placed on vanc and zosynon 4/28, Subsequently switched to Rocephin 2 g IV daily after telephone consultation with Dr. Johnnye Sima Dressing change today by Dr. Caralyn Guile, discussed with Dr. Johnnye Sima as the patient was adamant about leaving as yesterday. Patient transitioned to Augmentin orally 3 weeks. He is not a candidate for long-term IV antibiotics because of his polysubstance abuse   Polysubstance abuse- UDS positive for cocaine, amphetamines, marijuana, negative HIV antibody extremely rude and screaming at staff, due to the request per Staff to search his room due to suspicion of using cocaine in the room, told RN in PACU that he used cocaine 2hours prior to I&D  HTN --PRN IV hydralazine for now; will need to be discharged on oral anti-hypertensive  Constipation Start MiraLAX  DM Hemoglobin A1c  7.6 Accu-Cheks 3 times a day --Low dose SSI while inpatient Now transition to glipizide  Mild hypokalemia noted;over repleted, potassium increased to 6.1 for which he was given Kayexalate    Discharge Exam:   Blood pressure 157/107, pulse 123, temperature 97.6 F (36.4 C), temperature source Oral, resp. rate 16, height 6' 3"  (1.905 m), weight 135.535 kg (298 lb 12.8 oz), SpO2 99 %.  General exam: Appears calm and comfortable  Respiratory system: Clear to auscultation. Respiratory effort normal. Cardiovascular system: S1 & S2 heard, RRR. No JVD, murmurs, rubs, gallops or clicks. No pedal edema. Gastrointestinal system: Abdomen is nondistended, soft and nontender. No organomegaly or masses felt. Normal bowel sounds heard. Central nervous system: Alert and oriented. No focal neurological deficits. Extremities: Symmetric 5 x 5 power. Right upper extremities Ace wrapped Skin: No rashes, lesions or ulcers Psychiatry: Judgement and insight appear normal. Mood & affect appropriate    Follow-up Information    Follow up with Linna Hoff, MD. Schedule an appointment as soon as possible for a visit in 4 days.   Specialty:  Orthopedic Surgery   Why:  call for appouintment    Contact information:   [address] [place] [address] [phone]       Go to [facility].   Why:  Post hospital foolow-up appointment scheduled for 09/09/2015 at 9:30 am   Contact information:   [place] [id number] [phone]      Signed: Reyne Dumas 09/06/2015, 1:15 PM        Time spent >45 mins

## 2015-09-07 LAB — HEPATITIS PANEL, ACUTE
HEP A IGM: NEGATIVE
HEP B S AG: NEGATIVE
Hep B C IgM: NEGATIVE

## 2015-09-08 LAB — ANAEROBIC CULTURE: GRAM STAIN: NONE SEEN

## 2015-09-08 LAB — CULTURE, BLOOD (ROUTINE X 2)
CULTURE: NO GROWTH
Culture: NO GROWTH

## 2015-09-09 ENCOUNTER — Inpatient Hospital Stay: Payer: Self-pay

## 2017-12-27 ENCOUNTER — Encounter (HOSPITAL_COMMUNITY): Payer: Self-pay | Admitting: Emergency Medicine

## 2017-12-27 ENCOUNTER — Emergency Department (HOSPITAL_COMMUNITY): Payer: Self-pay

## 2017-12-27 ENCOUNTER — Inpatient Hospital Stay (HOSPITAL_COMMUNITY)
Admission: EM | Admit: 2017-12-27 | Discharge: 2018-01-01 | DRG: 194 | Disposition: A | Discharge status: Home / Self Care | Payer: Self-pay | Attending: Internal Medicine | Admitting: Internal Medicine

## 2017-12-27 ENCOUNTER — Other Ambulatory Visit: Payer: Self-pay

## 2017-12-27 DIAGNOSIS — F429 Obsessive-compulsive disorder, unspecified: Secondary | ICD-10-CM | POA: Diagnosis present

## 2017-12-27 DIAGNOSIS — J181 Lobar pneumonia, unspecified organism: Secondary | ICD-10-CM

## 2017-12-27 DIAGNOSIS — F1510 Other stimulant abuse, uncomplicated: Secondary | ICD-10-CM | POA: Diagnosis present

## 2017-12-27 DIAGNOSIS — Z87891 Personal history of nicotine dependence: Secondary | ICD-10-CM

## 2017-12-27 DIAGNOSIS — J189 Pneumonia, unspecified organism: Principal | ICD-10-CM | POA: Diagnosis present

## 2017-12-27 DIAGNOSIS — F1410 Cocaine abuse, uncomplicated: Secondary | ICD-10-CM | POA: Diagnosis present

## 2017-12-27 DIAGNOSIS — I10 Essential (primary) hypertension: Secondary | ICD-10-CM | POA: Diagnosis present

## 2017-12-27 DIAGNOSIS — F1910 Other psychoactive substance abuse, uncomplicated: Secondary | ICD-10-CM | POA: Diagnosis present

## 2017-12-27 DIAGNOSIS — Z79899 Other long term (current) drug therapy: Secondary | ICD-10-CM

## 2017-12-27 DIAGNOSIS — B9561 Methicillin susceptible Staphylococcus aureus infection as the cause of diseases classified elsewhere: Secondary | ICD-10-CM | POA: Diagnosis present

## 2017-12-27 DIAGNOSIS — D739 Disease of spleen, unspecified: Secondary | ICD-10-CM

## 2017-12-27 DIAGNOSIS — Z9889 Other specified postprocedural states: Secondary | ICD-10-CM

## 2017-12-27 DIAGNOSIS — R7881 Bacteremia: Secondary | ICD-10-CM | POA: Diagnosis present

## 2017-12-27 DIAGNOSIS — Y95 Nosocomial condition: Secondary | ICD-10-CM | POA: Diagnosis present

## 2017-12-27 DIAGNOSIS — Z7984 Long term (current) use of oral hypoglycemic drugs: Secondary | ICD-10-CM

## 2017-12-27 DIAGNOSIS — F1210 Cannabis abuse, uncomplicated: Secondary | ICD-10-CM | POA: Diagnosis present

## 2017-12-27 DIAGNOSIS — D7389 Other diseases of spleen: Secondary | ICD-10-CM | POA: Diagnosis present

## 2017-12-27 DIAGNOSIS — Z833 Family history of diabetes mellitus: Secondary | ICD-10-CM

## 2017-12-27 DIAGNOSIS — E119 Type 2 diabetes mellitus without complications: Secondary | ICD-10-CM | POA: Diagnosis present

## 2017-12-27 LAB — CBC WITH DIFFERENTIAL/PLATELET
Basophils Absolute: 0 10*3/uL (ref 0.0–0.1)
Basophils Relative: 0 %
EOS PCT: 2 %
Eosinophils Absolute: 0.2 10*3/uL (ref 0.0–0.7)
HEMATOCRIT: 39.3 % (ref 39.0–52.0)
Hemoglobin: 12.9 g/dL — ABNORMAL LOW (ref 13.0–17.0)
LYMPHS ABS: 2 10*3/uL (ref 0.7–4.0)
LYMPHS PCT: 28 %
MCH: 27.6 pg (ref 26.0–34.0)
MCHC: 32.8 g/dL (ref 30.0–36.0)
MCV: 84 fL (ref 78.0–100.0)
MONO ABS: 0.5 10*3/uL (ref 0.1–1.0)
Monocytes Relative: 7 %
NEUTROS ABS: 4.6 10*3/uL (ref 1.7–7.7)
Neutrophils Relative %: 63 %
PLATELETS: 416 10*3/uL — AB (ref 150–400)
RBC: 4.68 MIL/uL (ref 4.22–5.81)
RDW: 13.6 % (ref 11.5–15.5)
WBC: 7.3 10*3/uL (ref 4.0–10.5)

## 2017-12-27 LAB — MRSA PCR SCREENING: MRSA by PCR: NEGATIVE

## 2017-12-27 LAB — COMPREHENSIVE METABOLIC PANEL
ALT: 29 U/L (ref 0–44)
ANION GAP: 9 (ref 5–15)
AST: 34 U/L (ref 15–41)
Albumin: 2.9 g/dL — ABNORMAL LOW (ref 3.5–5.0)
Alkaline Phosphatase: 81 U/L (ref 38–126)
BILIRUBIN TOTAL: 0.3 mg/dL (ref 0.3–1.2)
BUN: 5 mg/dL — AB (ref 6–20)
CHLORIDE: 102 mmol/L (ref 98–111)
CO2: 30 mmol/L (ref 22–32)
Calcium: 8.8 mg/dL — ABNORMAL LOW (ref 8.9–10.3)
Creatinine, Ser: 0.61 mg/dL (ref 0.61–1.24)
GFR calc Af Amer: >60 mL/min (ref >60)
Glucose, Bld: 177 mg/dL — ABNORMAL HIGH (ref 70–99)
POTASSIUM: 4 mmol/L (ref 3.5–5.1)
Sodium: 141 mmol/L (ref 135–145)
TOTAL PROTEIN: 8.5 g/dL — AB (ref 6.5–8.1)

## 2017-12-27 LAB — RAPID URINE DRUG SCREEN, HOSP PERFORMED
AMPHETAMINES: NOT DETECTED
BARBITURATES: NOT DETECTED
Benzodiazepines: NOT DETECTED
Cocaine: NOT DETECTED
Opiates: NOT DETECTED
TETRAHYDROCANNABINOL: NOT DETECTED

## 2017-12-27 LAB — URINALYSIS, ROUTINE W REFLEX MICROSCOPIC
BILIRUBIN URINE: NEGATIVE
Glucose, UA: 50 mg/dL — AB
HGB URINE DIPSTICK: NEGATIVE
Ketones, ur: NEGATIVE mg/dL
Leukocytes, UA: NEGATIVE
Nitrite: NEGATIVE
Protein, ur: NEGATIVE mg/dL
SPECIFIC GRAVITY, URINE: 1.01 (ref 1.005–1.030)
pH: 7 (ref 5.0–8.0)

## 2017-12-27 LAB — CBG MONITORING, ED: GLUCOSE-CAPILLARY: 191 mg/dL — AB (ref 70–99)

## 2017-12-27 LAB — GLUCOSE, CAPILLARY: Glucose-Capillary: 220 mg/dL — ABNORMAL HIGH (ref 70–99)

## 2017-12-27 LAB — I-STAT CG4 LACTIC ACID, ED: Lactic Acid, Venous: 1.8 mmol/L (ref 0.5–1.9)

## 2017-12-27 LAB — EXPECTORATED SPUTUM ASSESSMENT W REFEX TO RESP CULTURE

## 2017-12-27 LAB — EXPECTORATED SPUTUM ASSESSMENT W GRAM STAIN, RFLX TO RESP C

## 2017-12-27 LAB — LIPASE, BLOOD: Lipase: 270 U/L — ABNORMAL HIGH (ref 11–51)

## 2017-12-27 MED ORDER — SODIUM CHLORIDE 0.9 % IV BOLUS
1000.0000 mL | Freq: Once | INTRAVENOUS | Status: AC
  Administered 2017-12-27: 1000 mL via INTRAVENOUS

## 2017-12-27 MED ORDER — INSULIN ASPART 100 UNIT/ML ~~LOC~~ SOLN
0.0000 [IU] | Freq: Three times a day (TID) | SUBCUTANEOUS | Status: DC
  Administered 2017-12-28 (×2): 1 [IU] via SUBCUTANEOUS
  Administered 2017-12-28: 3 [IU] via SUBCUTANEOUS
  Administered 2017-12-29: 1 [IU] via SUBCUTANEOUS
  Administered 2017-12-29 – 2017-12-30 (×2): 2 [IU] via SUBCUTANEOUS

## 2017-12-27 MED ORDER — VANCOMYCIN HCL 10 G IV SOLR
1500.0000 mg | Freq: Three times a day (TID) | INTRAVENOUS | Status: DC
  Administered 2017-12-28 (×2): 1500 mg via INTRAVENOUS
  Filled 2017-12-27 (×3): qty 1500

## 2017-12-27 MED ORDER — SODIUM CHLORIDE 0.9% FLUSH: 3.0000 mL | INTRAVENOUS | Status: DC | PRN

## 2017-12-27 MED ORDER — INSULIN ASPART 100 UNIT/ML ~~LOC~~ SOLN
0.0000 [IU] | Freq: Every day | SUBCUTANEOUS | Status: DC
  Administered 2017-12-27 – 2017-12-30 (×2): 2 [IU] via SUBCUTANEOUS

## 2017-12-27 MED ORDER — HYDROCODONE-ACETAMINOPHEN 5-325 MG PO TABS
1.0000 | ORAL_TABLET | ORAL | Status: DC | PRN
  Administered 2017-12-28: 2 via ORAL
  Administered 2017-12-30 (×2): 1 via ORAL
  Filled 2017-12-27: qty 2
  Filled 2017-12-27: qty 1
  Filled 2017-12-27: qty 2

## 2017-12-27 MED ORDER — SODIUM CHLORIDE 0.9 % IV SOLN
2.0000 g | Freq: Once | INTRAVENOUS | Status: AC
  Administered 2017-12-27: 2 g via INTRAVENOUS
  Filled 2017-12-27: qty 2

## 2017-12-27 MED ORDER — KETOROLAC TROMETHAMINE 30 MG/ML IJ SOLN
30.0000 mg | Freq: Four times a day (QID) | INTRAMUSCULAR | Status: DC | PRN
  Administered 2017-12-27 – 2017-12-31 (×10): 30 mg via INTRAVENOUS
  Filled 2017-12-27 (×10): qty 1

## 2017-12-27 MED ORDER — ACETAMINOPHEN 650 MG RE SUPP: 650.0000 mg | Freq: Four times a day (QID) | RECTAL | Status: DC | PRN

## 2017-12-27 MED ORDER — IOPAMIDOL (ISOVUE-300) INJECTION 61%
100.0000 mL | Freq: Once | INTRAVENOUS | Status: AC | PRN
  Administered 2017-12-27: 100 mL via INTRAVENOUS

## 2017-12-27 MED ORDER — VANCOMYCIN HCL 10 G IV SOLR
2000.0000 mg | Freq: Once | INTRAVENOUS | Status: AC
  Administered 2017-12-27: 2000 mg via INTRAVENOUS
  Filled 2017-12-27: qty 2000

## 2017-12-27 MED ORDER — BISACODYL 5 MG PO TBEC: 5.0000 mg | DELAYED_RELEASE_TABLET | Freq: Every day | ORAL | Status: DC | PRN

## 2017-12-27 MED ORDER — ACETAMINOPHEN 325 MG PO TABS: 650.0000 mg | ORAL_TABLET | Freq: Four times a day (QID) | ORAL | Status: DC | PRN

## 2017-12-27 MED ORDER — SODIUM CHLORIDE 0.9 % IV SOLN
2.0000 g | Freq: Three times a day (TID) | INTRAVENOUS | Status: DC
  Administered 2017-12-28 (×3): 2 g via INTRAVENOUS
  Filled 2017-12-27 (×3): qty 2

## 2017-12-27 MED ORDER — SODIUM CHLORIDE 0.9 % IV SOLN: 250.0000 mL | INTRAVENOUS | Status: DC | PRN

## 2017-12-27 MED ORDER — HYDROMORPHONE HCL 1 MG/ML IJ SOLN
1.0000 mg | Freq: Once | INTRAMUSCULAR | Status: AC
  Administered 2017-12-27: 1 mg via INTRAVENOUS
  Filled 2017-12-27: qty 1

## 2017-12-27 MED ORDER — ONDANSETRON HCL 4 MG PO TABS: 4.0000 mg | ORAL_TABLET | Freq: Four times a day (QID) | ORAL | Status: DC | PRN

## 2017-12-27 MED ORDER — SENNOSIDES-DOCUSATE SODIUM 8.6-50 MG PO TABS: 1.0000 | ORAL_TABLET | Freq: Every evening | ORAL | Status: DC | PRN

## 2017-12-27 MED ORDER — IOPAMIDOL (ISOVUE-300) INJECTION 61%
INTRAVENOUS | Status: AC
  Filled 2017-12-27: qty 100

## 2017-12-27 MED ORDER — ONDANSETRON HCL 4 MG/2ML IJ SOLN: 4.0000 mg | Freq: Four times a day (QID) | INTRAMUSCULAR | Status: DC | PRN

## 2017-12-27 MED ORDER — SODIUM CHLORIDE 0.9% FLUSH
3.0000 mL | Freq: Two times a day (BID) | INTRAVENOUS | Status: DC
  Administered 2017-12-27 – 2017-12-31 (×8): 3 mL via INTRAVENOUS

## 2017-12-27 NOTE — ED Notes (Signed)
ED TO INPATIENT HANDOFF REPORT  Name/Age/Gender [name] [name] 37 y.o. male  Code Status    Code Status Orders  (From admission, onward)         Start     Ordered   12/27/17 2002  Full code  Continuous     12/27/17 2004        Code Status History    Date Active Date Inactive Code Status Order ID Comments User Context   09/03/2015 0108 09/06/2015 1550 Full Code [id number]  Lily Kocher, MD Inpatient      Home/SNF/Other Home  Chief Complaint possible septic   Level of Care/Admitting Diagnosis ED Disposition    ED Disposition Condition [facility] Area: [facility] [[id number]]  Level of Care: Med-Surg [16]  Diagnosis: Pneumonia [227785]  Admitting Physician: Vianne Bulls [[id number]]  Attending Physician: Vianne Bulls [[id number]]  PT Class (Do Not Modify): Observation [104]  PT Acc Code (Do Not Modify): Observation [10022]       Medical History Past Medical History:  Diagnosis Date  . Amphetamine abuse ([place])   . Cocaine abuse ([place])   . Compulsive skin picking   . Diabetes mellitus without complication ([place])   . Hypertension   . Marijuana abuse   . OCD (obsessive compulsive disorder)     Allergies Allergies  Allergen Reactions  . Demerol [Meperidine] Other (See Comments)    Makes him crazy  . Hydrocodone Itching    IV Location/Drains/Wounds Patient Lines/Drains/Airways Status   Active Line/Drains/Airways    Name:   Placement date:   Placement time:   Site:   Days:   Peripheral IV 12/27/17 Left Forearm   12/27/17    1423    Forearm   less than 1   Peripheral IV 12/27/17 Right Forearm   12/27/17    1423    Forearm   less than 1   Open Drain 1 Right Other (Comment)    09/03/15    1110    Other (Comment)   846   Incision (Closed) 09/03/15 Arm Right   09/03/15    1119     846   Wound / Incision (Open or Dehisced) 09/03/15 Other (Comment) Arm Right   09/03/15    0056    Arm   846          Labs/Imaging Results for  orders placed or performed during the hospital encounter of 12/27/17 (from the past 48 hour(s))  CBG monitoring, ED     Status: Abnormal   Collection Time: 12/27/17 12:28 PM  Result Value Ref Range   Glucose-Capillary 191 (H) 70 - 99 mg/dL  CBC with Differential/Platelet     Status: Abnormal   Collection Time: 12/27/17  2:08 PM  Result Value Ref Range   WBC 7.3 4.0 - 10.5 K/uL   RBC 4.68 4.22 - 5.81 MIL/uL   Hemoglobin 12.9 (L) 13.0 - 17.0 g/dL   HCT 39.3 39.0 - 52.0 %   MCV 84.0 78.0 - 100.0 fL   MCH 27.6 26.0 - 34.0 pg   MCHC 32.8 30.0 - 36.0 g/dL   RDW 13.6 11.5 - 15.5 %   Platelets 416 (H) 150 - 400 K/uL   Neutrophils Relative % 63 %   Neutro Abs 4.6 1.7 - 7.7 K/uL   Lymphocytes Relative 28 %   Lymphs Abs 2.0 0.7 - 4.0 K/uL   Monocytes Relative 7 %   Monocytes Absolute 0.5 0.1 - 1.0 K/uL  Eosinophils Relative 2 %   Eosinophils Absolute 0.2 0.0 - 0.7 K/uL   Basophils Relative 0 %   Basophils Absolute 0.0 0.0 - 0.1 K/uL    Comment: Performed at [facility], [place] [address] [place], [place] [address]  Comprehensive metabolic panel     Status: Abnormal   Collection Time: 12/27/17  2:08 PM  Result Value Ref Range   Sodium 141 135 - 145 mmol/L   Potassium 4.0 3.5 - 5.1 mmol/L   Chloride 102 98 - 111 mmol/L   CO2 30 22 - 32 mmol/L   Glucose, Bld 177 (H) 70 - 99 mg/dL   BUN 5 (L) 6 - 20 mg/dL   Creatinine, Ser 0.61 0.61 - 1.24 mg/dL   Calcium 8.8 (L) 8.9 - 10.3 mg/dL   Total Protein 8.5 (H) 6.5 - 8.1 g/dL   Albumin 2.9 (L) 3.5 - 5.0 g/dL   AST 34 15 - 41 U/L   ALT 29 0 - 44 U/L   Alkaline Phosphatase 81 38 - 126 U/L   Total Bilirubin 0.3 0.3 - 1.2 mg/dL   GFR calc non Af Amer >60 >60 mL/min   GFR calc Af Amer >60 >60 mL/min    Comment: (NOTE) The eGFR has been calculated using the CKD EPI equation. This calculation has not been validated in all clinical situations. eGFR's persistently <60 mL/min signify possible Chronic Kidney Disease.    Anion  gap 9 5 - 15    Comment: Performed at [facility], [place] [address] [place], [place] [address]  Lipase, blood     Status: Abnormal   Collection Time: 12/27/17  2:08 PM  Result Value Ref Range   Lipase 270 (H) 11 - 51 U/L    Comment: Performed at [facility], [place] [address] [place], [place] [address]  I-Stat CG4 Lactic Acid, ED     Status: None   Collection Time: 12/27/17  6:54 PM  Result Value Ref Range   Lactic Acid, Venous 1.80 0.5 - 1.9 mmol/L  Urinalysis, Routine w reflex microscopic     Status: Abnormal   Collection Time: 12/27/17  7:16 PM  Result Value Ref Range   Color, Urine YELLOW YELLOW   APPearance CLEAR CLEAR   Specific Gravity, Urine 1.010 1.005 - 1.030   pH 7.0 5.0 - 8.0   Glucose, UA 50 (A) NEGATIVE mg/dL   Hgb urine dipstick NEGATIVE NEGATIVE   Bilirubin Urine NEGATIVE NEGATIVE   Ketones, ur NEGATIVE NEGATIVE mg/dL   Protein, ur NEGATIVE NEGATIVE mg/dL   Nitrite NEGATIVE NEGATIVE   Leukocytes, UA NEGATIVE NEGATIVE    Comment: Performed at [facility], [place] [address], [place], [place] [address]  Urine rapid drug screen (hosp performed)     Status: None   Collection Time: 12/27/17  7:16 PM  Result Value Ref Range   Opiates NONE DETECTED NONE DETECTED   Cocaine NONE DETECTED NONE DETECTED   Benzodiazepines NONE DETECTED NONE DETECTED   Amphetamines NONE DETECTED NONE DETECTED   Tetrahydrocannabinol NONE DETECTED NONE DETECTED   Barbiturates NONE DETECTED NONE DETECTED    Comment: (NOTE) DRUG SCREEN FOR MEDICAL PURPOSES ONLY.  IF CONFIRMATION IS NEEDED FOR ANY PURPOSE, NOTIFY LAB WITHIN 5 DAYS. LOWEST DETECTABLE LIMITS FOR URINE DRUG SCREEN Drug Class                     Cutoff (ng/mL) Amphetamine and metabolites    1000 Barbiturate and  metabolites    200 Benzodiazepine                 572 Tricyclics and metabolites     300 Opiates and metabolites        300 Cocaine and metabolites        300 THC                             50 Performed at [facility], [place] [address] [place], [place] [address]    Dg Chest 2 View  Result Date: 12/27/2017 CLINICAL DATA:  Chest pain. EXAM: CHEST - 2 VIEW COMPARISON:  Radiograph of December 15, 2017. FINDINGS: The heart size and mediastinal contours are within normal limits. No pneumothorax is noted. Small bilateral pleural effusions are noted. Mild bibasilar subsegmental atelectasis is noted. The visualized skeletal structures are unremarkable. IMPRESSION: Mild bibasilar subsegmental atelectasis with small pleural effusions. Electronically Signed   By: Marijo Conception, M.D.   On: 12/27/2017 14:03   Ct Abdomen Pelvis W Contrast  Result Date: 12/27/2017 CLINICAL DATA:  37 y/o  M; cough, fatigue, generalized body aches. EXAM: CT ABDOMEN AND PELVIS WITH CONTRAST TECHNIQUE: Multidetector CT imaging of the abdomen and pelvis was performed using the standard protocol following bolus administration of intravenous contrast. CONTRAST:  153m ISOVUE-300 IOPAMIDOL (ISOVUE-300) INJECTION 61% COMPARISON:  12/21/2017 CT abdomen and pelvis. FINDINGS: Lower chest: Increased small bilateral pleural effusions and persistent consolidations within the lung bases. Hepatobiliary: No focal liver abnormality is seen. No gallstones, gallbladder wall thickening, or biliary dilatation. Pancreas: Unremarkable. No pancreatic ductal dilatation or surrounding inflammatory changes. Spleen: Collection along the posteromedial margin of the spleen is decreased in size measuring 6.5 x 4.4 cm (series 2, image 39). Trace residual collection along the anterior superior margin of the spleen (series 2, image 25). Adrenals/Urinary Tract: Adrenal glands are unremarkable. Kidneys are normal, without renal calculi, focal lesion, or hydronephrosis. Minimal bladder wall thickening, decreased from the prior study. Stomach/Bowel: Stomach is within normal limits. Appendectomy. No evidence of bowel  wall thickening, distention, or inflammatory changes. Vascular/Lymphatic: No significant vascular findings are present. No enlarged abdominal or pelvic lymph nodes. Reproductive: Prostate is unremarkable. Other: No abdominal wall hernia or abnormality. No abdominopelvic ascites. Musculoskeletal: Stable probable hematoma in the right flank subcutaneous fat measuring up to 10 cm. No acute fracture. IMPRESSION: 1. Increased small bilateral pleural effusions and persistent small consolidations in the lung bases which may represent atelectasis or pneumonia. 2. Decreased size of perisplenic fluid collections. 3. Decreased bladder wall thickening and edema in the pelvis. 4. Stable hematoma in the right flank subcutaneous fat. Electronically Signed   By: LKristine GarbeM.D.   On: 12/27/2017 17:32    Pending Labs Unresulted Labs (From admission, onward)    Start     Ordered   12/28/17 0500  HIV antibody (Routine Testing)  Tomorrow morning,   R     12/27/17 2004   12/28/17 05974 Basic metabolic panel  Tomorrow morning,   R     12/27/17 2004   12/28/17 0500  CBC WITH DIFFERENTIAL  Tomorrow morning,   R     12/27/17 2004   12/27/17 2028  MRSA PCR Screening  Once,   R     12/27/17 2027   12/27/17 2013  Strep pneumoniae urinary antigen  Add-on,   R     12/27/17 2013   12/27/17 2012  Legionella Pneumophila  Serogp 1 Ur Ag  Add-on,   R     12/27/17 2013   12/27/17 2005  Procalcitonin - Baseline  Add-on,   STAT     12/27/17 2004   12/27/17 2002  Culture, sputum-assessment  Once,   R     12/27/17 2004   12/27/17 2002  Gram stain  Once,   R     12/27/17 2004   12/27/17 1903  Urine culture  STAT,   STAT     12/27/17 1902   12/27/17 1336  Blood culture (routine x 2)  BLOOD CULTURE X 2,   STAT     12/27/17 1335          Vitals/Pain Today's Vitals   12/27/17 1809 12/27/17 1811 12/27/17 1915 12/27/17 1915  BP: (!) 147/103 (!) 147/103 (!) 139/103   Pulse: 95 91 95   Resp: 20 (!) 25 18   Temp:   98.8 F (37.1 C)    TempSrc:  Oral    SpO2: 96% 99% 98%   Weight:  127 kg    Height:  _0  (1.905 m)    PainSc:    6     Isolation Precautions No active isolations  Medications Medications  iopamidol (ISOVUE-300) 61 % injection (has no administration in time range)  vancomycin (VANCOCIN) 2,000 mg in sodium chloride 0.9 % 500 mL IVPB (2,000 mg Intravenous New Bag/Given 12/27/17 1915)  sodium chloride flush (NS) 0.9 % injection 3 mL (has no administration in time range)  sodium chloride flush (NS) 0.9 % injection 3 mL (has no administration in time range)  0.9 %  sodium chloride infusion (has no administration in time range)  acetaminophen (TYLENOL) tablet 650 mg (has no administration in time range)    Or  acetaminophen (TYLENOL) suppository 650 mg (has no administration in time range)  HYDROcodone-acetaminophen (NORCO/VICODIN) 5-325 MG per tablet 1-2 tablet (has no administration in time range)  ketorolac (TORADOL) 30 MG/ML injection 30 mg (has no administration in time range)  senna-docusate (Senokot-S) tablet 1 tablet (has no administration in time range)  bisacodyl (DULCOLAX) EC tablet 5 mg (has no administration in time range)  ondansetron (ZOFRAN) tablet 4 mg (has no administration in time range)    Or  ondansetron (ZOFRAN) injection 4 mg (has no administration in time range)  ceFEPIme (MAXIPIME) 1 g in sodium chloride 0.9 % 100 mL IVPB (has no administration in time range)  insulin aspart (novoLOG) injection 0-9 Units (has no administration in time range)  insulin aspart (novoLOG) injection 0-5 Units (has no administration in time range)  vancomycin (VANCOCIN) 1,500 mg in sodium chloride 0.9 % 500 mL IVPB (has no administration in time range)  sodium chloride 0.9 % bolus 1,000 mL (0 mLs Intravenous Stopped 12/27/17 1544)  iopamidol (ISOVUE-300) 61 % injection 100 mL (100 mLs Intravenous Contrast Given 12/27/17 1649)  ceFEPIme (MAXIPIME) 2 g in sodium chloride 0.9 % 100 mL IVPB  (0 g Intravenous Stopped 12/27/17 2009)  HYDROmorphone (DILAUDID) injection 1 mg (1 mg Intravenous Given 12/27/17 1851)  sodium chloride 0.9 % bolus 1,000 mL (0 mLs Intravenous Stopped 12/27/17 2009)    Mobility walks

## 2017-12-27 NOTE — ED Provider Notes (Signed)
[facility]-EMERGENCY DEPT Provider Note   CSN: [id number] Arrival date & time: 12/27/17  1211     History   Chief Complaint Chief Complaint  Patient presents with  . Cough    HPI Pearson Picou is a 37 y.o. male.  HPI 37 year old male presenting to the emergency department with complaints of generalized fatigue cough myalgias and some generalized upper abdominal pain.  He underwent appendectomy at [facility] on December 13, 2017.  He was recently seen in outside emergency department 2 days ago and was told he had pneumonia but he was unhappy with the care he received there and therefore he left AGAINST MEDICAL ADVICE.  He presents with ongoing symptoms at this time.  He is not on antibiotics.  Reports nausea without vomiting.  Denies diarrhea.  Reports chills without documented fever.  Generalized upper abdominal pain   Past Medical History:  Diagnosis Date  . Amphetamine abuse ([facility])   . Cocaine abuse ([facility])   . Compulsive skin picking   . Diabetes mellitus without complication ([facility])   . Hypertension   . Marijuana abuse   . OCD (obsessive compulsive disorder)     Patient Active Problem List   Diagnosis Date Noted  . Cellulitis of right forearm   . Abscess of right forearm 09/03/2015  . Sepsis ([facility]) 09/03/2015  . HTN (hypertension) 09/03/2015  . DM (diabetes mellitus) ([facility]) 09/03/2015  . SIRS (systemic inflammatory response syndrome) ([facility]) 09/03/2015  . Amphetamine abuse ([facility])   . Marijuana abuse   . Cocaine abuse ([facility])   . Abscess of forearm, right 09/02/2015    Past Surgical History:  Procedure Laterality Date  . arm surgery    . I&D EXTREMITY Right 09/03/2015   Procedure: IRRIGATION AND DEBRIDEMENT FOREARM;  Surgeon: Bradly Bienenstock, MD;  Location: [facility] OR;  Service: Orthopedics;  Laterality: Right;        Home Medications    Prior to Admission medications   Medication Sig Start Date End Date Taking? Authorizing Provider  glipiZIDE  (GLUCOTROL) 5 MG tablet Take 1 tablet (5 mg total) by mouth daily before breakfast. Patient not taking: Reported on 12/27/2017 09/06/15   Richarda Overlie, MD  nicotine (NICODERM CQ - DOSED IN MG/24 HOURS) 14 mg/24hr patch Place 1 patch (14 mg total) onto the skin daily. Patient not taking: Reported on 12/27/2017 09/06/15   Richarda Overlie, MD  oxyCODONE (OXY IR/ROXICODONE) 5 MG immediate release tablet Take 1 tablet (5 mg total) by mouth every 6 (six) hours as needed for moderate pain. Patient not taking: Reported on 12/27/2017 09/06/15   Richarda Overlie, MD  saccharomyces boulardii (FLORASTOR) 250 MG capsule Take 1 capsule (250 mg total) by mouth 2 (two) times daily. Patient not taking: Reported on 12/27/2017 09/06/15   Richarda Overlie, MD    Family History Family History  Problem Relation Age of Onset  . Diabetes Mother   . Hypertension Mother   . Diabetes Father   . Heart attack Father     Social History Social History   Tobacco Use  . Smoking status: Former Games developer  . Smokeless tobacco: Never Used  Substance Use Topics  . Alcohol use: No  . Drug use: Yes    Types: Amphetamines, Cocaine, Marijuana     Allergies   Demerol [meperidine] and Hydrocodone   Review of Systems Review of Systems  All other systems reviewed and are negative.    Physical Exam Updated Vital Signs BP (!) 129/94 (BP Location: Left Arm)  Pulse 96   Temp 98.5 F (36.9 C) (Oral)   Resp 15   SpO2 99%   Physical Exam  Constitutional: He is oriented to person, place, and time. He appears well-developed and well-nourished.  HENT:  Head: Normocephalic and atraumatic.  Eyes: EOM are normal.  Neck: Normal range of motion.  Cardiovascular: Normal rate, regular rhythm, normal heart sounds and intact distal pulses.  Pulmonary/Chest: Effort normal and breath sounds normal. No respiratory distress.  Abdominal: Soft. He exhibits no distension.  Mild upper abdominal tenderness without guarding or rebound.  Laparoscopic  incisions well-healed  Musculoskeletal: Normal range of motion.  Neurological: He is alert and oriented to person, place, and time.  Skin: Skin is warm and dry.  Psychiatric: He has a normal mood and affect. Judgment normal.  Nursing note and vitals reviewed.    ED Treatments / Results  Labs (all labs ordered are listed, but only abnormal results are displayed) Labs Reviewed  CBC WITH DIFFERENTIAL/PLATELET - Abnormal; Notable for the following components:      Result Value   Hemoglobin 12.9 (*)    Platelets 416 (*)    All other components within normal limits  COMPREHENSIVE METABOLIC PANEL - Abnormal; Notable for the following components:   Glucose, Bld 177 (*)    BUN 5 (*)    Calcium 8.8 (*)    Total Protein 8.5 (*)    Albumin 2.9 (*)    All other components within normal limits  LIPASE, BLOOD - Abnormal; Notable for the following components:   Lipase 270 (*)    All other components within normal limits  CBG MONITORING, ED - Abnormal; Notable for the following components:   Glucose-Capillary 191 (*)    All other components within normal limits  CULTURE, BLOOD (ROUTINE X 2)  CULTURE, BLOOD (ROUTINE X 2)    EKG None  Radiology Dg Chest 2 View  Result Date: 12/27/2017 CLINICAL DATA:  Chest pain. EXAM: CHEST - 2 VIEW COMPARISON:  Radiograph of December 15, 2017. FINDINGS: The heart size and mediastinal contours are within normal limits. No pneumothorax is noted. Small bilateral pleural effusions are noted. Mild bibasilar subsegmental atelectasis is noted. The visualized skeletal structures are unremarkable. IMPRESSION: Mild bibasilar subsegmental atelectasis with small pleural effusions. Electronically Signed   By: Lupita Raider, M.D.   On: 12/27/2017 14:03    Procedures Procedures (including critical care time)  Medications Ordered in ED Medications  sodium chloride 0.9 % bolus 1,000 mL (1,000 mLs Intravenous New Bag/Given 12/27/17 1443)     Initial Impression /  Assessment and Plan / ED Course  I have reviewed the triage vital signs and the nursing notes.  Pertinent labs & imaging results that were available during my care of the patient were reviewed by me and considered in my medical decision making (see chart for details).     CT imaging pending at this time given his upper abdominal pain and recent surgery.  Lipase is mildly elevated at 270.  Chest x-ray without obvious focal pneumonia at this time.  Patient was somewhat slurred speech at this time which likely is medication related.  He does have a history of substance abuse.  I suspect this is related to some sort of substance abuse issue.  No pain medication will be given until his mental status clears.  Care transferred to oncoming provider  Final Clinical Impressions(s) / ED Diagnoses   Final diagnoses:  None    ED Discharge Orders  None       Azalia Bilisampos, Jantz Main, MD 12/27/17 [phone]

## 2017-12-27 NOTE — ED Notes (Signed)
Patient transported to CT 

## 2017-12-27 NOTE — Progress Notes (Signed)
Pharmacy Antibiotic Note  [name] [name] is a 37 y.o. male admitted on 12/27/2017 with pneumonia. to ED 8/23 with cough/myalgias, was seen at another ED dept 8/21, dx with PNA, left AMA. Treat as HCAP [facility] splenic abscess [facility]/17 Pharmacy has been consulted for Vancomycin and Cefepime dosing.  Plan: Cefepime 2gm q8 Vancomycin 2gm x1, then 1500mg  q8hr  Height: 6\' 3"  (190.5 cm) Weight: 280 lb (127 kg) IBW/kg (Calculated) : 84.5  Temp (24hrs), Avg:98.7 F (37.1 C), Min:98.5 F (36.9 C), Max:98.8 F (37.1 C)  Recent Labs  Lab 12/27/17 1408 12/27/17 1854  WBC 7.3  --   CREATININE 0.61  --   LATICACIDVEN  --  1.80    Estimated Creatinine Clearance: 178 mL/min (by C-G formula based on SCr of 0.61 mg/dL).    Allergies  Allergen Reactions  . Demerol [Meperidine] Other (See Comments)    Makes him crazy  . Hydrocodone Itching   Antimicrobials this admission: 8/23 Cefepime >>  8/23 Vancomycin >>   Dose adjustments this admission:  Microbiology results: 8/23 BCx: sent         Sputum: ordered  8/23 MRSA PCR: ordered         Legionella/Strep pneumo: ordered  Thank you for allowing pharmacy to be a part of this patient's care.  Otho BellowsGreen, Suleyman Ehrman L PharmD Pager [phone] 12/27/2017, 8:37 PM

## 2017-12-27 NOTE — ED Notes (Signed)
Bed: WA08 Expected date:  Expected time:  Means of arrival:  Comments: EMS-post op issue

## 2017-12-27 NOTE — Progress Notes (Signed)
A consult was received from an ED physician for Vancomycin per pharmacy dosing.  The patient's profile has been reviewed for ht/wt/allergies/indication/available labs.   A one time order has been placed for Vancomycin 2gm to be given after Cefepime 2gm IV x1.  Further antibiotics/pharmacy consults should be ordered by admitting physician if indicated.                       Thank you,  Otho BellowsGreen, Shaletta Hinostroza L 12/27/2017  6:29 PM

## 2017-12-27 NOTE — H&P (Signed)
History and Physical    [name] [name] ZOX:[id number] DOB: 01/28/1981 DOA: 12/27/2017  PCP: Patient, No Pcp Per   Patient coming from: Home   Chief Complaint: Cough, SOB, malaise, aches   HPI: [name] [name] is a 37 y.o. male with medical history significant for polysubstance abuse, type 2 diabetes mellitus, and recent admission with acute appendicitis and sepsis with MSSA bacteremia, now presenting to the emergency department for evaluation of productive cough, shortness of breath, general malaise, and aches.  Patient was admitted to an outside hospital on 12/13/2017 with sepsis secondary to acute appendicitis, underwent laparoscopic appendectomy, MSSA grew from peritoneal fluid and blood cultures, he was noted to have splenic fluid collections that were favored to represent hematoma or seroma rather than abscess, was seen by ID and transition from empiric broad-spectrum antibiotics to cefazolin, doxycycline was later added for pneumonia, and the patient left AMA on 12/21/2017 prior to completing treatment. Blood cultures from 12/14/17 remained negative. He had and echo during recent admit that was negative for vegetation.  Since leaving, he reports that his productive cough has gradually worsened and he has developed progressive fatigue and general malaise.  He denies chest pain.  He has some mild abdominal pain that has improved significantly from the recent hospitalization.  He denies fevers.  Denies recent alcohol or illicit substance use.  ED Course: Upon arrival to the ED, patient is found to be afebrile, saturating adequately on room air, and hypertensive.  Chemistry panel is notable for an albumin of 2.9 and CBC features a mild thrombocytosis.  Lactic acid is reassuringly normal.  UDS is negative.  Lipase is mildly elevated.  Urinalysis is unremarkable.  Blood and urine cultures were collected, 2 L of normal saline administered, and the patient was treated with vancomycin and cefepime in the ED.   Given his recent MSSA bacteremia for which he may not have completed treatment prior to leaving AMA, and now with worsening systemic symptoms, hospitalist were asked to admit for ongoing evaluation and management.  Review of Systems:  All other systems reviewed and apart from HPI, are negative.  Past Medical History:  Diagnosis Date  . Amphetamine abuse (HCC)   . Cocaine abuse (HCC)   . Compulsive skin picking   . Diabetes mellitus without complication (HCC)   . Hypertension   . Marijuana abuse   . OCD (obsessive compulsive disorder)     Past Surgical History:  Procedure Laterality Date  . arm surgery    . I&D EXTREMITY Right 09/03/2015   Procedure: IRRIGATION AND DEBRIDEMENT FOREARM;  Surgeon: Bradly Bienenstock, MD;  Location: [facility] OR;  Service: Orthopedics;  Laterality: Right;     reports that he has quit smoking. He has never used smokeless tobacco. He reports that he has current or past drug history. Drugs: Amphetamines, Cocaine, and Marijuana. He reports that he does not drink alcohol.  Allergies  Allergen Reactions  . Demerol [Meperidine] Other (See Comments)    Makes him crazy  . Hydrocodone Itching    Family History  Problem Relation Age of Onset  . Diabetes Mother   . Hypertension Mother   . Diabetes Father   . Heart attack Father      Prior to Admission medications   Medication Sig Start Date End Date Taking? Authorizing Provider  glipiZIDE (GLUCOTROL) 5 MG tablet Take 1 tablet (5 mg total) by mouth daily before breakfast. Patient not taking: Reported on 12/27/2017 09/06/15   Richarda Overlie, MD  nicotine (NICODERM CQ - DOSED  IN MG/24 HOURS) 14 mg/24hr patch Place 1 patch (14 mg total) onto the skin daily. Patient not taking: Reported on 12/27/2017 09/06/15   Richarda Overlie, MD  oxyCODONE (OXY IR/ROXICODONE) 5 MG immediate release tablet Take 1 tablet (5 mg total) by mouth every 6 (six) hours as needed for moderate pain. Patient not taking: Reported on 12/27/2017 09/06/15    Richarda Overlie, MD  saccharomyces boulardii (FLORASTOR) 250 MG capsule Take 1 capsule (250 mg total) by mouth 2 (two) times daily. Patient not taking: Reported on 12/27/2017 09/06/15   Richarda Overlie, MD    Physical Exam: Vitals:   12/27/17 1330 12/27/17 1332 12/27/17 1809 12/27/17 1811  BP: (!) 139/98 (!) 129/94 (!) 147/103 (!) 147/103  Pulse: 99 96 95 91  Resp: 18 15 20  (!) 25  Temp: 98.5 F (36.9 C)   98.8 F (37.1 C)  TempSrc: Oral Oral  Oral  SpO2: 100% 99% 96% 99%  Weight:    127 kg  Height:    6\' 3"  (1.905 m)      Constitutional: NAD, calm  Eyes: PERTLA, lids and conjunctivae normal ENMT: Mucous membranes are moist. Posterior pharynx clear of any exudate or lesions.   Neck: normal, supple, no masses, no thyromegaly Respiratory: Slightly diminished breath sounds at bases. Mild tachypnea. No accessory muscle use.  Cardiovascular: S1 & S2 heard, regular rate and rhythm. No extremity edema.   Abdomen: No distension, soft, mild generalized tenderness without rebound pain or guarding. Bowel sounds active.  Musculoskeletal: no clubbing / cyanosis. No joint deformity upper and lower extremities. Normal muscle tone.  Skin: Scattered hyperpigmented patches about the extremities. Warm, dry, well-perfused. Neurologic: CN 2-12 grossly intact. Sensation intact, DTR normal. Strength 5/5 in all 4 limbs.  Psychiatric: Alert and oriented x 3. Calm, cooperative.     Labs on Admission: I have personally reviewed following labs and imaging studies  CBC: Recent Labs  Lab 12/27/17 1408  WBC 7.3  NEUTROABS 4.6  HGB 12.9*  HCT 39.3  MCV 84.0  PLT 416*   Basic Metabolic Panel: Recent Labs  Lab 12/27/17 1408  NA 141  K 4.0  CL 102  CO2 30  GLUCOSE 177*  BUN 5*  CREATININE 0.61  CALCIUM 8.8*   GFR: Estimated Creatinine Clearance: 178 mL/min (by C-G formula based on SCr of 0.61 mg/dL). Liver Function Tests: Recent Labs  Lab 12/27/17 1408  AST 34  ALT 29  ALKPHOS 81    BILITOT 0.3  PROT 8.5*  ALBUMIN 2.9*   Recent Labs  Lab 12/27/17 1408  LIPASE 270*   No results for input(s): AMMONIA in the last 168 hours. Coagulation Profile: No results for input(s): INR, PROTIME in the last 168 hours. Cardiac Enzymes: No results for input(s): CKTOTAL, CKMB, CKMBINDEX, TROPONINI in the last 168 hours. BNP (last 3 results) No results for input(s): PROBNP in the last 8760 hours. HbA1C: No results for input(s): HGBA1C in the last 72 hours. CBG: Recent Labs  Lab 12/27/17 1228  GLUCAP 191*   Lipid Profile: No results for input(s): CHOL, HDL, LDLCALC, TRIG, CHOLHDL, LDLDIRECT in the last 72 hours. Thyroid Function Tests: No results for input(s): TSH, T4TOTAL, FREET4, T3FREE, THYROIDAB in the last 72 hours. Anemia Panel: No results for input(s): VITAMINB12, FOLATE, FERRITIN, TIBC, IRON, RETICCTPCT in the last 72 hours. Urine analysis:    Component Value Date/Time   COLORURINE YELLOW 12/27/2017 1916   APPEARANCEUR CLEAR 12/27/2017 1916   LABSPEC 1.010 12/27/2017 1916   PHURINE 7.0  12/27/2017 1916   GLUCOSEU 50 (A) 12/27/2017 1916   HGBUR NEGATIVE 12/27/2017 1916   BILIRUBINUR NEGATIVE 12/27/2017 1916   KETONESUR NEGATIVE 12/27/2017 1916   PROTEINUR NEGATIVE 12/27/2017 1916   NITRITE NEGATIVE 12/27/2017 1916   LEUKOCYTESUR NEGATIVE 12/27/2017 1916   Sepsis Labs: @LABRCNTIP (procalcitonin:4,lacticidven:4) )No results found for this or any previous visit (from the past 240 hour(s)).   Radiological Exams on Admission: Dg Chest 2 View  Result Date: 12/27/2017 CLINICAL DATA:  Chest pain. EXAM: CHEST - 2 VIEW COMPARISON:  Radiograph of December 15, 2017. FINDINGS: The heart size and mediastinal contours are within normal limits. No pneumothorax is noted. Small bilateral pleural effusions are noted. Mild bibasilar subsegmental atelectasis is noted. The visualized skeletal structures are unremarkable. IMPRESSION: Mild bibasilar subsegmental atelectasis with  small pleural effusions. Electronically Signed   By: Lupita Raider, M.D.   On: 12/27/2017 14:03   Ct Abdomen Pelvis W Contrast  Result Date: 12/27/2017 CLINICAL DATA:  37 y/o  M; cough, fatigue, generalized body aches. EXAM: CT ABDOMEN AND PELVIS WITH CONTRAST TECHNIQUE: Multidetector CT imaging of the abdomen and pelvis was performed using the standard protocol following bolus administration of intravenous contrast. CONTRAST:  ISOVUE-300 IOPAMIDOL (ISOVUE-300) INJECTION 61% COMPARISON:  12/21/2017 CT abdomen and pelvis. FINDINGS: Lower chest: Increased small bilateral pleural effusions and persistent consolidations within the lung bases. Hepatobiliary: No focal liver abnormality is seen. No gallstones, gallbladder wall thickening, or biliary dilatation. Pancreas: Unremarkable. No pancreatic ductal dilatation or surrounding inflammatory changes. Spleen: Collection along the posteromedial margin of the spleen is decreased in size measuring 6.5 x 4.4 cm (series 2, image 39). Trace residual collection along the anterior superior margin of the spleen (series 2, image 25). Adrenals/Urinary Tract: Adrenal glands are unremarkable. Kidneys are normal, without renal calculi, focal lesion, or hydronephrosis. Minimal bladder wall thickening, decreased from the prior study. Stomach/Bowel: Stomach is within normal limits. Appendectomy. No evidence of bowel wall thickening, distention, or inflammatory changes. Vascular/Lymphatic: No significant vascular findings are present. No enlarged abdominal or pelvic lymph nodes. Reproductive: Prostate is unremarkable. Other: No abdominal wall hernia or abnormality. No abdominopelvic ascites. Musculoskeletal: Stable probable hematoma in the right flank subcutaneous fat measuring up to 10 cm. No acute fracture. IMPRESSION: 1. Increased small bilateral pleural effusions and persistent small consolidations in the lung bases which may represent atelectasis or pneumonia. 2. Decreased  size of perisplenic fluid collections. 3. Decreased bladder wall thickening and edema in the pelvis. 4. Stable hematoma in the right flank subcutaneous fat. Electronically Signed   By: Mitzi Hansen M.D.   On: 12/27/2017 17:32    EKG: Not performed.   Assessment/Plan  1. Pneumonia  - Patient is presenting with productive cough, SOB, fatigue and malaise  - He was being treated for pneumonia during recent hospitalization when he left AMA  - He is afebrile today, there is no leukocytosis, and he is not hypoxic but consolidations on imaging were felt to reflect PNA given the productive cough and malaise  - Blood cultures were collected in the ED and he was started on empiric vancomycin and cefepime  - Given the recent bacteremia with pt leaving AMA prior to completing treatment, and now with worsening respiratory and systemic symptoms, observation in hospital was recommended  - Check sputum culture, strep pneumo antigen, and procalcitonin; continue current antibiotics while following cultures and clinical course   2. MSSA bacteremia  - Blood and peritoneal fluid cultures from 12/13/17 grew MSSA in setting of acute appendicitis -  Echo during recent admission was negative for vegetation  - ID provided consultation during recent admission and he was transitioned from empiric broad-spectrum antibiotics to cefazolin on 12/16/17  - Repeat cultures from 12/14/17 remained negative  - Patient left the hospital 12/21/17 AMA  - Blood cultures repeated in ED today and he was placed back on broad-spectrum antibiotics with concern for possible HCAP    3. Type II DM  - Managed with glipizide at home, held on admission  - Check CBG's and use a low-intensity SSI with Novolog for now    4. Splenic fluid collection - Splenic fluid collections noted during recent admission, surgeon at that time felt it was likely seroma or hematoma rather than abscess  - Blood cultures were collected in ED and he is on  antibiotics - Collections have decreased on CT 12/27/17  5. Polysubstance abuse  - UDS negative on admission     DVT prophylaxis: SCD's  Code Status: Full  Family Communication: Discussed with patient Consults called: None Admission status: Observation     Briscoe Deutscherimothy S Kikue Gerhart, MD [facility] Pager [phone]  If 7PM-7AM, please contact night-coverage [email or web address] Password [email or web address]  12/27/2017, 8:05 PM

## 2017-12-27 NOTE — ED Provider Notes (Signed)
Patient is excepted signout from Dr. Patria Maneampos.  Patient is presenting with ongoing cough and abdominal pain.  Dr. Patria Maneampos had ordered a repeat CT scan of the abdomen due to the patient's symptoms with plan to follow-up on results for final disposition.  Patient had been [facility] as outlined in copied note below;  Hospital Course: Patient is a 37 year old with past medical history significant for DM 2, morbid obesity, HTN, cocaine and amphetamine use who presented to the Medical Center after he signed out AGAINST MEDICAL ADVICE. Patient was admitted for acute appendicitis and he is postoperative status post laparoscopic appendectomy. Additionally patient was found to have bacteremia and a large splenic abscess versus hematoma. Patient's last CT scan of the abdomen and pelvis was impressive for an interval enlargement of subcapsular splenic collection concerning for splenic abscess. Patient's peritoneal fluid cultures were positive for Staphylococcus, MSSA. Patient's blood cultures x2 were positive for the same. As per infectious disease patient was being treated with cefazolin. Patient signed out AGAINST MEDICAL ADVICE, and was very noncompliant with care including wearing his telemetry monitor. Patient stated that "he was going to walk with his girlfriend". Patient was being treated for pneumonia, sepsis, and further evaluated for process going on in his spleen.  Physical Exam  BP (!) 147/103 (BP Location: Left Arm)   Pulse 91   Temp 98.8 F (37.1 C) (Oral)   Resp (!) 25   Ht 6\' 3"  (1.905 m)   Wt 127 kg   SpO2 99%   BMI 35.00 kg/m   Physical Exam Patient is alert but irritable.  He does not have respiratory distress.  Heart borderline tachycardia.  Lungs grossly clear however patient has very large chest wall with decreased breath sounds at the lower lung fields.  Incisions of abdominal surgery are well-healed.  Patient endorses discomfort throughout  palpation of his abdomen.  This does not localize.  No Guarding.  No peripheral edema. ED Course/Procedures     Procedures  MDM  CT scan indicates consolidations at both lung bases.  No acute intra-abdominal prostheses.  Findings consistent with H CAP.  Antibiotics initiated.  Fluids continued.  Patient had 1 L of fluids prior to assuming care.  I have added lactic acid.  Blood cultures are already pending.       Arby BarrettePfeiffer, Joella Saefong, MD 12/27/17 [id number]

## 2017-12-27 NOTE — ED Triage Notes (Signed)
Patient here from home via EMS with complaints of cough, fatigue, and generalized body aches. 8/9 appendectomy, was told that he had pneumonia and abscess but decided to leave AMA on 8/17. Worse since.

## 2017-12-28 DIAGNOSIS — J9811 Atelectasis: Secondary | ICD-10-CM

## 2017-12-28 DIAGNOSIS — R7881 Bacteremia: Secondary | ICD-10-CM

## 2017-12-28 DIAGNOSIS — Z9049 Acquired absence of other specified parts of digestive tract: Secondary | ICD-10-CM

## 2017-12-28 DIAGNOSIS — Z8719 Personal history of other diseases of the digestive system: Secondary | ICD-10-CM

## 2017-12-28 DIAGNOSIS — Z87891 Personal history of nicotine dependence: Secondary | ICD-10-CM

## 2017-12-28 DIAGNOSIS — B9561 Methicillin susceptible Staphylococcus aureus infection as the cause of diseases classified elsewhere: Secondary | ICD-10-CM

## 2017-12-28 DIAGNOSIS — Z885 Allergy status to narcotic agent status: Secondary | ICD-10-CM

## 2017-12-28 LAB — BASIC METABOLIC PANEL
ANION GAP: 9 (ref 5–15)
BUN: 10 mg/dL (ref 6–20)
CALCIUM: 8.5 mg/dL — AB (ref 8.9–10.3)
CO2: 27 mmol/L (ref 22–32)
CREATININE: 0.71 mg/dL (ref 0.61–1.24)
Chloride: 104 mmol/L (ref 98–111)
GLUCOSE: 157 mg/dL — AB (ref 70–99)
Potassium: 4 mmol/L (ref 3.5–5.1)
Sodium: 140 mmol/L (ref 135–145)

## 2017-12-28 LAB — CBC WITH DIFFERENTIAL/PLATELET
BASOS ABS: 0 10*3/uL (ref 0.0–0.1)
BASOS PCT: 0 %
EOS ABS: 0.2 10*3/uL (ref 0.0–0.7)
Eosinophils Relative: 3 %
HCT: 34.2 % — ABNORMAL LOW (ref 39.0–52.0)
Hemoglobin: 11.2 g/dL — ABNORMAL LOW (ref 13.0–17.0)
Lymphocytes Relative: 30 %
Lymphs Abs: 1.8 10*3/uL (ref 0.7–4.0)
MCH: 27.6 pg (ref 26.0–34.0)
MCHC: 32.7 g/dL (ref 30.0–36.0)
MCV: 84.2 fL (ref 78.0–100.0)
MONO ABS: 0.5 10*3/uL (ref 0.1–1.0)
MONOS PCT: 8 %
NEUTROS ABS: 3.6 10*3/uL (ref 1.7–7.7)
Neutrophils Relative %: 59 %
Platelets: 354 10*3/uL (ref 150–400)
RBC: 4.06 MIL/uL — ABNORMAL LOW (ref 4.22–5.81)
RDW: 13.7 % (ref 11.5–15.5)
WBC: 6.1 10*3/uL (ref 4.0–10.5)

## 2017-12-28 LAB — STREP PNEUMONIAE URINARY ANTIGEN: STREP PNEUMO URINARY ANTIGEN: NEGATIVE

## 2017-12-28 LAB — GLUCOSE, CAPILLARY
GLUCOSE-CAPILLARY: 133 mg/dL — AB (ref 70–99)
GLUCOSE-CAPILLARY: 213 mg/dL — AB (ref 70–99)
Glucose-Capillary: 144 mg/dL — ABNORMAL HIGH (ref 70–99)
Glucose-Capillary: 206 mg/dL — ABNORMAL HIGH (ref 70–99)

## 2017-12-28 LAB — PROCALCITONIN

## 2017-12-28 LAB — HIV ANTIBODY (ROUTINE TESTING W REFLEX): HIV SCREEN 4TH GENERATION: NONREACTIVE

## 2017-12-28 MED ORDER — LORATADINE 10 MG PO TABS
10.0000 mg | ORAL_TABLET | Freq: Every day | ORAL | Status: DC
  Administered 2017-12-28 – 2017-12-31 (×3): 10 mg via ORAL
  Filled 2017-12-28 (×4): qty 1

## 2017-12-28 MED ORDER — CEFAZOLIN SODIUM-DEXTROSE 2-4 GM/100ML-% IV SOLN
2.0000 g | Freq: Three times a day (TID) | INTRAVENOUS | Status: DC
  Administered 2017-12-28 – 2017-12-31 (×9): 2 g via INTRAVENOUS
  Filled 2017-12-28 (×10): qty 100

## 2017-12-28 MED ORDER — FLUTICASONE PROPIONATE 50 MCG/ACT NA SUSP
2.0000 | Freq: Every day | NASAL | Status: DC
  Administered 2017-12-28 – 2017-12-29 (×2): 2 via NASAL
  Filled 2017-12-28: qty 16

## 2017-12-28 MED ORDER — NICOTINE 21 MG/24HR TD PT24
21.0000 mg | MEDICATED_PATCH | TRANSDERMAL | Status: DC
  Filled 2017-12-28 (×2): qty 1

## 2017-12-28 MED ORDER — NICOTINE 21 MG/24HR TD PT24: 21.0000 mg | MEDICATED_PATCH | Freq: Once | TRANSDERMAL | Status: DC

## 2017-12-28 NOTE — Consult Note (Signed)
[facility]       Reason for Consult: MSSA bacteremia    Referring Physician: Dr. Janee Morn  Principal Problem:   HCAP (healthcare-associated pneumonia) Active Problems:   Diabetes mellitus type II, non insulin dependent (HCC)   Polysubstance abuse (HCC)   Splenic lesion   MSSA bacteremia   . fluticasone  2 spray Each Nare Daily  . insulin aspart  0-5 Units Subcutaneous QHS  . insulin aspart  0-9 Units Subcutaneous TID WC  . loratadine  10 mg Oral Daily  . sodium chloride flush  3 mL Intravenous Q12H    Recommendations: Monitor blood cultures TEE Ancef Stop cefepime   Assessment: He has recent known MSSA bacteremia with TTE negative for vegetation. He also had appendicitis and peritoneal fluid also with MSSA making it the likely source.     Antibiotics: Vancomycin and cefepime  HPI: [name] [name] is a 37 y.o. male with a remote history of IVDU presented initially to [facility] with abdominal paina nd found to have acute appendicitis.  He underwent appendectomy and found to have positive MSSA bacteremia.  His CT also was concerning for splenic abscess and plan for him to be on IV antibiotics at Short Stay for a prolonged duration (not specified in the notes).  He though left AMA on 8/17 and has not been on antibiotics since.  CT of his abdomen now noted improvement in the splenic fluid collection.  He came in with pain all over and some sob and cough.  CXR with some bibasilar atelectasis but no hypoxia, no productive sputum.  Blood cultures at [facility] negative the second day, blood cultures here ngtd.  UDS negative at [facility] and here.   CXR independently reviewed and no significant opacity noted  Review of Systems:  Constitutional: negative for fevers and chills Gastrointestinal: negative for diarrhea Musculoskeletal: negative for myalgias, arthralgias and stiff joints All other systems reviewed and are negative    Past Medical History:  Diagnosis  Date  . Amphetamine abuse (HCC)   . Cocaine abuse (HCC)   . Compulsive skin picking   . Diabetes mellitus without complication (HCC)   . Hypertension   . Marijuana abuse   . OCD (obsessive compulsive disorder)     Social History   Tobacco Use  . Smoking status: Former Games developer  . Smokeless tobacco: Never Used  Substance Use Topics  . Alcohol use: No  . Drug use: Yes    Types: Amphetamines, Cocaine, Marijuana    Family History  Problem Relation Age of Onset  . Diabetes Mother   . Hypertension Mother   . Diabetes Father   . Heart attack Father     Allergies  Allergen Reactions  . Demerol [Meperidine] Other (See Comments)    Makes him crazy  . Hydrocodone Itching    Physical Exam: Constitutional: in no apparent distress and alert  Vitals:   12/28/17 0608 12/28/17 1406  BP: (!) 130/98 132/89  Pulse: 83 (!) 101  Resp: 20 20  Temp:  98 F (36.7 C)  SpO2: 100% 91%   EYES: anicteric ENMT: no thrush Cardiovascular: Cor RRR without murmur Respiratory: CTAB; normal respiratory effort GI: Bowel sounds are normal, liver is not enlarged, spleen is not enlarged Musculoskeletal: no pedal edema noted Skin: diffuse circular hyperpigmented areas on arms, legs, toso Hematologic: no cervical lad  Lab Results  Component Value Date   WBC 6.1 12/28/2017   HGB 11.2 (L) 12/28/2017   HCT 34.2 (  L) 12/28/2017   MCV 84.2 12/28/2017   PLT 354 12/28/2017    Lab Results  Component Value Date   CREATININE 0.71 12/28/2017   BUN 10 12/28/2017   NA 140 12/28/2017   K 4.0 12/28/2017   CL 104 12/28/2017   CO2 27 12/28/2017    Lab Results  Component Value Date   ALT 29 12/27/2017   AST 34 12/27/2017   ALKPHOS 81 12/27/2017     Microbiology: Recent Results (from the past 240 hour(s))  Blood culture (routine x 2)     Status: None (Preliminary result)   Collection Time: 12/27/17  2:09 PM  Result Value Ref Range Status   Specimen Description   Final    BLOOD LEFT  FOREARM Performed at [facility], [address] [place], [place] [address]    Special Requests   Final    BOTTLES DRAWN AEROBIC AND ANAEROBIC Blood Culture adequate volume Performed at [facility], [address] [place], [place] [address]    Culture   Final    NO GROWTH < 24 HOURS Performed at [facility], [address] [place], [place] [address]    Report Status PENDING  Incomplete  Blood culture (routine x 2)     Status: None (Preliminary result)   Collection Time: 12/27/17  2:22 PM  Result Value Ref Range Status   Specimen Description   Final    BLOOD RIGHT FOREARM Performed at [facility], [address] [place], [place] [address]    Special Requests   Final    BOTTLES DRAWN AEROBIC AND ANAEROBIC Blood Culture adequate volume Performed at [facility], [address] [place], [place] [address]    Culture   Final    NO GROWTH < 24 HOURS Performed at [facility], [address] [place], [place] [address]    Report Status PENDING  Incomplete  Culture, sputum-assessment     Status: None   Collection Time: 12/27/17  8:02 PM  Result Value Ref Range Status   Specimen Description EXPECTORATED SPUTUM  Final   Special Requests NONE  Final   Sputum evaluation   Final    Sputum specimen not acceptable for testing.  Please recollect.    NOTIFIED BNKE AT 2240 ON 12/27/17 Performed at [facility], [address] [place], [place] [address]    Report Status 12/27/2017 FINAL  Final  MRSA PCR Screening     Status: None   Collection Time: 12/27/17  8:28 PM  Result Value Ref Range Status   MRSA by PCR NEGATIVE NEGATIVE Final    Comment:        The GeneXpert MRSA Assay (FDA approved for NASAL specimens only), is one component of a comprehensive MRSA colonization surveillance program. It is not intended to diagnose MRSA infection nor to guide or monitor treatment  for MRSA infections. Performed at [facility], [address] [place], [place] [address]     Gardiner Barefootobert W Anwar Crill, MD [facility] [email or web address] [phone] pager  [phone] cell 12/28/2017, 5:41 PM

## 2017-12-28 NOTE — Progress Notes (Addendum)
PROGRESS NOTE    [name] [name]  ZOX:[id number] DOB: 02-04-1981 DOA: 12/27/2017 PCP: Patient, No Pcp Per   Brief Narrative:  [name] [name] is a 37 y.o. male with medical history significant for polysubstance abuse, type 2 diabetes mellitus, and recent admission with acute appendicitis and sepsis with MSSA bacteremia, now presenting to the emergency department for evaluation of productive cough, shortness of breath, general malaise, and aches.  Patient was admitted to an outside hospital on 12/13/2017 with sepsis secondary to acute appendicitis, underwent laparoscopic appendectomy, MSSA grew from peritoneal fluid and blood cultures, he was noted to have splenic fluid collections that were favored to represent hematoma or seroma rather than abscess, was seen by ID and transition from empiric broad-spectrum antibiotics to cefazolin, doxycycline was later added for pneumonia, and the patient left AMA on 12/21/2017 prior to completing treatment. Blood cultures from 12/14/17 remained negative. He had and echo during recent admit that was negative for vegetation.  Since leaving, he reports that his productive cough has gradually worsened and he has developed progressive fatigue and general malaise.  He denies chest pain.  He has some mild abdominal pain that has improved significantly from the recent hospitalization.  He denies fevers.  Denies recent alcohol or illicit substance use.  ED Course: Upon arrival to the ED, patient is found to be afebrile, saturating adequately on room air, and hypertensive.  Chemistry panel is notable for an albumin of 2.9 and CBC features a mild thrombocytosis.  Lactic acid is reassuringly normal.  UDS is negative.  Lipase is mildly elevated.  Urinalysis is unremarkable.  Blood and urine cultures were collected, 2 L of normal saline administered, and the patient was treated with vancomycin and cefepime in the ED.  Given his recent MSSA bacteremia for which he may not have completed  treatment prior to leaving AMA, and now with worsening systemic symptoms, hospitalist were asked to admit for ongoing evaluation and management.   Assessment & Plan:   Principal Problem:   HCAP (healthcare-associated pneumonia) Active Problems:   Diabetes mellitus type II, non insulin dependent (HCC)   Polysubstance abuse (HCC)   Splenic lesion   MSSA bacteremia  #1 Healthcare associated pneumonia Patient presented with productive cough, shortness of breath, fatigue and malaise.  Patient during last hospitalization was being treated for pneumonia however left AMA.  Patient currently afebrile.  Normal white count.  CT abdomen and pelvis with con consolidations in the bases to reflect either pneumonia or atelectasis.  However patient with productive cough and malaise and as such being treated as a pneumonia.  Blood cultures pending.  Sputum Gram stain and culture pending.  MRSA PCR negative.  Urine Legionella antigen pending.  Urine pneumococcus antigen pending.  Discontinue IV vancomycin and continue IV cefepime.  Placed on Flonase and Claritin.  Will consult with ID as patient with a recently complicated illness with acute appendicitis, bacteremia, healthcare associated pneumonia and left outside hospital AMA.  Patient also with a history of polysubstance abuse.  Follow.  2.  MSSA bacteremia From outside hospital blood and peritoneal fluid cultures from 12/13/2017 were positive for MSSA in the setting of an acute appendicitis.  Patient noted to have a 2D echo done which was negative for vegetations. It was noted that patient was seen in consultation by ID and was transitioned from empiric broad-spectrum antibiotics to cefazolin on 12/16/2017 however patient left AMA on 12/21/2017.  Repeat cultures from 12/14/2017 were negative.  Blood cultures were ordered on admission and currently pending.  Continue empiric IV cefepime.  Will discontinue IV vancomycin as MRSA PCR has been negative.  ID consult.  3.   Type 2 diabetes mellitus Patient was on oral hypoglycemic agents at home which are currently on hold.  CBG 133 this morning.  Check a hemoglobin A1c.  Continue sliding scale insulin.  4.  Splenic fluid collection During recent admission was noted that surgeon felt likely a seroma or hematoma rather than an abscess.  Blood cultures have been repeated during this admission and are currently pending.  CT abdomen and pelvis which was done on admission with improvement with splenic fluid collections.  Monitor.  5.  Polysubstance abuse UDS negative on admission.  Monitor closely for withdrawal.  If patient with signs of withdrawal will need to place on the clonidine detox protocol.  Follow for now.   DVT prophylaxis: SCDs Code Status: Full Family Communication: Updated patient and mother at bedside. Disposition Plan: Home when medically improved clinically stable.   Consultants:   None  Procedures:   CT abdomen and pelvis 12/27/2017  Chest x-ray 12/27/2017  Antimicrobials:   IV cefepime 12/27/2017  IV vancomycin 12/27/2017>>>> 12/28/2017   Subjective: Laying in bed.  Denies chest pain no shortness of breath.  States cough improved since admission.  Feeling better.  Asking whether he will be able to go home later on today.  Objective: Vitals:   12/27/17 1811 12/27/17 1915 12/28/17 0608 12/28/17 1406  BP: (!) 147/103 (!) 139/103 (!) 130/98 132/89  Pulse: 91 95 83 (!) 101  Resp: (!) 25 18 20 20   Temp: 98.8 F (37.1 C)   98 F (36.7 C)  TempSrc: Oral   Oral  SpO2: 99% 98% 100% 91%  Weight: 127 kg     Height: 6\' 3"  (1.905 m)       Intake/Output Summary (Last 24 hours) at 12/28/2017 1643 Last data filed at 12/28/2017 0900 Gross per 24 hour  Intake 1562.43 ml  Output 950 ml  Net 612.43 ml   Filed Weights   12/27/17 1811  Weight: 127 kg    Examination:  General exam: Appears calm and comfortable  Respiratory system: Some coarse breath sounds in the bases.  No crackles.   No wheezing.  Respiratory effort normal. Cardiovascular system: S1 & S2 heard, RRR. No JVD, murmurs, rubs, gallops or clicks. No pedal edema. Gastrointestinal system: Abdomen is nondistended, soft and nontender. No organomegaly or masses felt. Normal bowel sounds heard. Central nervous system: Alert and oriented. No focal neurological deficits. Extremities: Symmetric 5 x 5 power. Skin: Lower extremity with chronic venous stasis changes noted.  Psychiatry: Judgement and insight appear fair. Mood & affect appropriate.     Data Reviewed: I have personally reviewed following labs and imaging studies  CBC: Recent Labs  Lab 12/27/17 1408 12/28/17 0603  WBC 7.3 6.1  NEUTROABS 4.6 3.6  HGB 12.9* 11.2*  HCT 39.3 34.2*  MCV 84.0 84.2  PLT 416* 354   Basic Metabolic Panel: Recent Labs  Lab 12/27/17 1408 12/28/17 0603  NA 141 140  K 4.0 4.0  CL 102 104  CO2 30 27  GLUCOSE 177* 157*  BUN 5* 10  CREATININE 0.61 0.71  CALCIUM 8.8* 8.5*   GFR: Estimated Creatinine Clearance: 178 mL/min (by C-G formula based on SCr of 0.71 mg/dL). Liver Function Tests: Recent Labs  Lab 12/27/17 1408  AST 34  ALT 29  ALKPHOS 81  BILITOT 0.3  PROT 8.5*  ALBUMIN 2.9*   Recent Labs  Lab  12/27/17 1408  LIPASE 270*   No results for input(s): AMMONIA in the last 168 hours. Coagulation Profile: No results for input(s): INR, PROTIME in the last 168 hours. Cardiac Enzymes: No results for input(s): CKTOTAL, CKMB, CKMBINDEX, TROPONINI in the last 168 hours. BNP (last 3 results) No results for input(s): PROBNP in the last 8760 hours. HbA1C: No results for input(s): HGBA1C in the last 72 hours. CBG: Recent Labs  Lab 12/27/17 1228 12/27/17 2148 12/28/17 0804 12/28/17 1215  GLUCAP 191* 220* 133* 213*   Lipid Profile: No results for input(s): CHOL, HDL, LDLCALC, TRIG, CHOLHDL, LDLDIRECT in the last 72 hours. Thyroid Function Tests: No results for input(s): TSH, T4TOTAL, FREET4, T3FREE,  THYROIDAB in the last 72 hours. Anemia Panel: No results for input(s): VITAMINB12, FOLATE, FERRITIN, TIBC, IRON, RETICCTPCT in the last 72 hours. Sepsis Labs: Recent Labs  Lab 12/27/17 1854 12/28/17 0603  PROCALCITON  --  <0.10  LATICACIDVEN 1.80  --     Recent Results (from the past 240 hour(s))  Blood culture (routine x 2)     Status: None (Preliminary result)   Collection Time: 12/27/17  2:09 PM  Result Value Ref Range Status   Specimen Description   Final    BLOOD LEFT FOREARM Performed at [facility], [address] [place], [place] [address]    Special Requests   Final    BOTTLES DRAWN AEROBIC AND ANAEROBIC Blood Culture adequate volume Performed at [facility], [address], [place], [place] [address]    Culture   Final    NO GROWTH < 24 HOURS Performed at [facility], [address], [place], [place] [address]    Report Status PENDING  Incomplete  Blood culture (routine x 2)     Status: None (Preliminary result)   Collection Time: 12/27/17  2:22 PM  Result Value Ref Range Status   Specimen Description   Final    BLOOD RIGHT FOREARM Performed at [facility], [address], [place], [place] [address]    Special Requests   Final    BOTTLES DRAWN AEROBIC AND ANAEROBIC Blood Culture adequate volume Performed at [facility], [address], [place], [place] [address]    Culture   Final    NO GROWTH < 24 HOURS Performed at [facility], [address] [place], [place] [address]    Report Status PENDING  Incomplete  Culture, sputum-assessment     Status: None   Collection Time: 12/27/17  8:02 PM  Result Value Ref Range Status   Specimen Description EXPECTORATED SPUTUM  Final   Special Requests NONE  Final   Sputum evaluation   Final    Sputum specimen not acceptable for testing.  Please recollect.    NOTIFIED BNKE AT 2240 ON 12/27/17 Performed at [facility], [address] [place], [place] [address]    Report Status 12/27/2017 FINAL  Final  MRSA PCR Screening     Status: None   Collection Time: 12/27/17  8:28 PM  Result Value Ref Range Status   MRSA by PCR NEGATIVE NEGATIVE Final    Comment:        The GeneXpert MRSA Assay (FDA approved for NASAL specimens only), is one component of a comprehensive MRSA colonization surveillance program. It is not intended to diagnose MRSA infection nor to guide or monitor treatment for MRSA infections. Performed at [facility], [address]  [place], [place] [id number]          Radiology Studies: Dg Chest 2 View  Result Date: 12/27/2017 CLINICAL DATA:  Chest pain. EXAM: CHEST - 2 VIEW COMPARISON:  Radiograph of December 15, 2017. FINDINGS: The heart size and mediastinal contours are within normal limits. No pneumothorax is noted. Small bilateral pleural effusions are noted. Mild bibasilar subsegmental atelectasis is noted. The visualized skeletal structures are unremarkable. IMPRESSION: Mild bibasilar subsegmental atelectasis with small pleural effusions. Electronically Signed   By: Lupita RaiderJames  Green Jr, M.D.   On: 12/27/2017 14:03   Ct Abdomen Pelvis W Contrast  Result Date: 12/27/2017 CLINICAL DATA:  37 y/o  M; cough, fatigue, generalized body aches. EXAM: CT ABDOMEN AND PELVIS WITH CONTRAST TECHNIQUE: Multidetector CT imaging of the abdomen and pelvis was performed using the standard protocol following bolus administration of intravenous contrast. CONTRAST:  100mL ISOVUE-300 IOPAMIDOL (ISOVUE-300) INJECTION 61% COMPARISON:  12/21/2017 CT abdomen and pelvis. FINDINGS: Lower chest: Increased small bilateral pleural effusions and persistent consolidations within the lung bases. Hepatobiliary: No focal liver abnormality is seen. No gallstones, gallbladder wall thickening, or biliary dilatation. Pancreas: Unremarkable. No pancreatic ductal dilatation or surrounding inflammatory  changes. Spleen: Collection along the posteromedial margin of the spleen is decreased in size measuring 6.5 x 4.4 cm (series 2, image 39). Trace residual collection along the anterior superior margin of the spleen (series 2, image 25). Adrenals/Urinary Tract: Adrenal glands are unremarkable. Kidneys are normal, without renal calculi, focal lesion, or hydronephrosis. Minimal bladder wall thickening, decreased from the prior study. Stomach/Bowel: Stomach is within normal limits. Appendectomy. No evidence of bowel wall thickening, distention, or inflammatory changes. Vascular/Lymphatic: No significant vascular findings are present. No enlarged abdominal or pelvic lymph nodes. Reproductive: Prostate is unremarkable. Other: No abdominal wall hernia or abnormality. No abdominopelvic ascites. Musculoskeletal: Stable probable hematoma in the right flank subcutaneous fat measuring up to 10 cm. No acute fracture. IMPRESSION: 1. Increased small bilateral pleural effusions and persistent small consolidations in the lung bases which may represent atelectasis or pneumonia. 2. Decreased size of perisplenic fluid collections. 3. Decreased bladder wall thickening and edema in the pelvis. 4. Stable hematoma in the right flank subcutaneous fat. Electronically Signed   By: Mitzi HansenLance  Furusawa-Stratton M.D.   On: 12/27/2017 17:32        Scheduled Meds: . fluticasone  2 spray Each Nare Daily  . insulin aspart  0-5 Units Subcutaneous QHS  . insulin aspart  0-9 Units Subcutaneous TID WC  . loratadine  10 mg Oral Daily  . sodium chloride flush  3 mL Intravenous Q12H   Continuous Infusions: . sodium chloride    . ceFEPime (MAXIPIME) IV 2 g (12/28/17 1633)  . vancomycin 1,500 mg (12/28/17 1232)     LOS: 0 days    Time spent: 40 minutes    Ramiro Harvestaniel Thompson, MD [facility] Pager [phone]  If 7PM-7AM, please contact night-coverage [email or web address] Password [email or web address] 12/28/2017, 4:43 PM

## 2017-12-29 DIAGNOSIS — J189 Pneumonia, unspecified organism: Principal | ICD-10-CM

## 2017-12-29 LAB — CBC WITH DIFFERENTIAL/PLATELET
BASOS ABS: 0 10*3/uL (ref 0.0–0.1)
BASOS PCT: 0 %
Eosinophils Absolute: 0.2 10*3/uL (ref 0.0–0.7)
Eosinophils Relative: 4 %
HEMATOCRIT: 32.1 % — AB (ref 39.0–52.0)
HEMOGLOBIN: 10.6 g/dL — AB (ref 13.0–17.0)
Lymphocytes Relative: 26 %
Lymphs Abs: 1.6 10*3/uL (ref 0.7–4.0)
MCH: 27.5 pg (ref 26.0–34.0)
MCHC: 33 g/dL (ref 30.0–36.0)
MCV: 83.2 fL (ref 78.0–100.0)
MONO ABS: 0.5 10*3/uL (ref 0.1–1.0)
Monocytes Relative: 8 %
NEUTROS PCT: 62 %
Neutro Abs: 3.8 10*3/uL (ref 1.7–7.7)
Platelets: 326 10*3/uL (ref 150–400)
RBC: 3.86 MIL/uL — ABNORMAL LOW (ref 4.22–5.81)
RDW: 13.7 % (ref 11.5–15.5)
WBC: 6.1 10*3/uL (ref 4.0–10.5)

## 2017-12-29 LAB — BASIC METABOLIC PANEL
ANION GAP: 6 (ref 5–15)
BUN: 11 mg/dL (ref 6–20)
CO2: 28 mmol/L (ref 22–32)
Calcium: 8.6 mg/dL — ABNORMAL LOW (ref 8.9–10.3)
Chloride: 106 mmol/L (ref 98–111)
Creatinine, Ser: 0.72 mg/dL (ref 0.61–1.24)
Glucose, Bld: 156 mg/dL — ABNORMAL HIGH (ref 70–99)
Potassium: 3.9 mmol/L (ref 3.5–5.1)
SODIUM: 140 mmol/L (ref 135–145)

## 2017-12-29 LAB — GLUCOSE, CAPILLARY
GLUCOSE-CAPILLARY: 133 mg/dL — AB (ref 70–99)
Glucose-Capillary: 179 mg/dL — ABNORMAL HIGH (ref 70–99)
Glucose-Capillary: 106 mg/dL — ABNORMAL HIGH (ref 70–99)
Glucose-Capillary: 153 mg/dL — ABNORMAL HIGH (ref 70–99)

## 2017-12-29 LAB — URINE CULTURE: CULTURE: NO GROWTH

## 2017-12-29 LAB — LEGIONELLA PNEUMOPHILA SEROGP 1 UR AG: L. PNEUMOPHILA SEROGP 1 UR AG: NEGATIVE

## 2017-12-29 LAB — MAGNESIUM: MAGNESIUM: 1.6 mg/dL — AB (ref 1.7–2.4)

## 2017-12-29 MED ORDER — MAGNESIUM SULFATE 4 GM/100ML IV SOLN
4.0000 g | Freq: Once | INTRAVENOUS | Status: AC
  Administered 2017-12-29: 4 g via INTRAVENOUS
  Filled 2017-12-29: qty 100

## 2017-12-29 NOTE — Progress Notes (Signed)
PROGRESS NOTE    [name] [name]  UJW:[id number] DOB: 04-08-1981 DOA: 12/27/2017 PCP: Patient, No Pcp Per   Brief Narrative:  [name] is a 37 y.o. male with medical history significant for polysubstance abuse, type 2 diabetes mellitus, and recent admission with acute appendicitis and sepsis with MSSA bacteremia, now presenting to the emergency department for evaluation of productive cough, shortness of breath, general malaise, and aches.  Patient was admitted to an outside hospital on 12/13/2017 with sepsis secondary to acute appendicitis, underwent laparoscopic appendectomy, MSSA grew from peritoneal fluid and blood cultures, he was noted to have splenic fluid collections that were favored to represent hematoma or seroma rather than abscess, was seen by ID and transition from empiric broad-spectrum antibiotics to cefazolin, doxycycline was later added for pneumonia, and the patient left AMA on 12/21/2017 prior to completing treatment. Blood cultures from 12/14/17 remained negative. He had and echo during recent admit that was negative for vegetation.  Since leaving, he reports that his productive cough has gradually worsened and he has developed progressive fatigue and general malaise.  He denies chest pain.  He has some mild abdominal pain that has improved significantly from the recent hospitalization.  He denies fevers.  Denies recent alcohol or illicit substance use.  ED Course: Upon arrival to the ED, patient is found to be afebrile, saturating adequately on room air, and hypertensive.  Chemistry panel is notable for an albumin of 2.9 and CBC features a mild thrombocytosis.  Lactic acid is reassuringly normal.  UDS is negative.  Lipase is mildly elevated.  Urinalysis is unremarkable.  Blood and urine cultures were collected, 2 L of normal saline administered, and the patient was treated with vancomycin and cefepime in the ED.  Given his recent MSSA bacteremia for which he may not have completed  treatment prior to leaving AMA, and now with worsening systemic symptoms, hospitalist were asked to admit for ongoing evaluation and management.   Assessment & Plan:   Principal Problem:   HCAP (healthcare-associated pneumonia) Active Problems:   Diabetes mellitus type II, non insulin dependent (HCC)   Polysubstance abuse (HCC)   Splenic lesion   MSSA bacteremia  #1 Healthcare associated pneumonia Patient presented with productive cough, shortness of breath, fatigue and malaise.  Patient during last hospitalization was being treated for pneumonia however left AMA.  Patient currently afebrile.  Normal white count.  CT abdomen and pelvis with con consolidations in the bases to reflect either pneumonia or atelectasis.  However patient with productive cough and malaise and as such being treated as a pneumonia.  Blood cultures pending.  Sputum Gram stain and culture pending.  MRSA PCR negative.  Urine Legionella antigen pending.  Urine pneumococcus antigen pending.  IV vancomycin IV cefepime have been discontinued.  Patient on IV Ancef.  Continue Claritin and Flonase.  Patient has been seen in consultation by ID who have narrowed IV antibiotics.  ID recommending patient undergo a TEE.  ID following and I appreciate input and recommendations.    2.  MSSA bacteremia From outside hospital blood and peritoneal fluid cultures from 12/13/2017 were positive for MSSA in the setting of an acute appendicitis.  Patient noted to have a 2D echo done which was negative for vegetations. It was noted that patient was seen in consultation by ID and was transitioned from empiric broad-spectrum antibiotics to cefazolin on 12/16/2017 however patient left AMA on 12/21/2017.  Repeat cultures from 12/14/2017 were negative.  Blood cultures were ordered on admission and currently  pending.  IV cefepime and IV vancomycin have been discontinued.  Patient on IV Ancef per ID recommendations.  ID recommending patient undergo a TEE.   Cardiology consulted for TEE.  ID following and appreciate input and recommendations.  3.  Type 2 diabetes mellitus Patient was on oral hypoglycemic agents at home which are currently on hold.  CBG 133 this morning.  Check hemoglobin A1c tomorrow.  Continue sliding scale insulin.   4.  Splenic fluid collection During recent admission was noted that surgeon felt likely a seroma or hematoma rather than an abscess.  Blood cultures have been repeated during this admission and are currently pending.  CT abdomen and pelvis which was done on admission with improvement with splenic fluid collections.  Monitor.  5.  Polysubstance abuse UDS negative on admission.  Monitor closely for withdrawal.  If patient with signs of withdrawal will need to place on the clonidine detox protocol.  Follow for now.   DVT prophylaxis: SCDs Code Status: Full Family Communication: Updated patient.  No family at bedside.  Disposition Plan: Home when medically improved clinically stable and when okay with ID.   Consultants:   Infectious disease: Dr.Comer  Procedures:   CT abdomen and pelvis 12/27/2017  Chest x-ray 12/27/2017  Antimicrobials:   IV cefepime 12/27/2017>>>> 12/28/2017  IV vancomycin 12/27/2017>>>> 12/28/2017  IV cefazolin 12/28/2017   Subjective: Sitting up.  Denies chest pain or shortness of breath.  Coughing improving.  Complaining of some burning left upper quadrant intermittent discomfort.    Objective: Vitals:   12/28/17 1406 12/28/17 2019 12/29/17 0427 12/29/17 0521  BP: 132/89 (!) 145/90 (!) 149/102 (!) 138/100  Pulse: (!) 101 84 84 93  Resp: 20 20 13 18   Temp: 98 F (36.7 C) 98.2 F (36.8 C) (!) 97.4 F (36.3 C) 98.2 F (36.8 C)  TempSrc: Oral Oral Axillary   SpO2: 91% 98% 95% 92%  Weight:      Height:        Intake/Output Summary (Last 24 hours) at 12/29/2017 1706 Last data filed at 12/29/2017 0241 Gross per 24 hour  Intake 300 ml  Output 550 ml  Net -250 ml   Filed  Weights   12/27/17 1811  Weight: 127 kg    Examination:  General exam: NAD Respiratory system: Coarse breath sounds in the bases.  No crackles, no wheezes, no rhonchi.  Normal respiratory effort.   Cardiovascular system: Regular rate and rhythm no murmurs rubs or gallops.  No JVD.  No lower extremity edema.   Gastrointestinal system: Abdomen is soft, nontender, nondistended, positive bowel sounds.  No rebound.  No guarding.   Central nervous system: Alert and oriented. No focal neurological deficits. Extremities: Symmetric 5 x 5 power. Skin: Lower extremity with chronic venous stasis changes noted.  Psychiatry: Judgement and insight appear fair. Mood & affect appropriate.     Data Reviewed: I have personally reviewed following labs and imaging studies  CBC: Recent Labs  Lab 12/27/17 1408 12/28/17 0603 12/29/17 0532  WBC 7.3 6.1 6.1  NEUTROABS 4.6 3.6 3.8  HGB 12.9* 11.2* 10.6*  HCT 39.3 34.2* 32.1*  MCV 84.0 84.2 83.2  PLT 416* 354 326   Basic Metabolic Panel: Recent Labs  Lab 12/27/17 1408 12/28/17 0603 12/29/17 0532  NA 141 140 140  K 4.0 4.0 3.9  CL 102 104 106  CO2 30 27 28   GLUCOSE 177* 157* 156*  BUN 5* 10 11  CREATININE 0.61 0.71 0.72  CALCIUM 8.8* 8.5* 8.6*  MG  --   --  1.6*   GFR: Estimated Creatinine Clearance: 178 mL/min (by C-G formula based on SCr of 0.72 mg/dL). Liver Function Tests: Recent Labs  Lab 12/27/17 1408  AST 34  ALT 29  ALKPHOS 81  BILITOT 0.3  PROT 8.5*  ALBUMIN 2.9*   Recent Labs  Lab 12/27/17 1408  LIPASE 270*   No results for input(s): AMMONIA in the last 168 hours. Coagulation Profile: No results for input(s): INR, PROTIME in the last 168 hours. Cardiac Enzymes: No results for input(s): CKTOTAL, CKMB, CKMBINDEX, TROPONINI in the last 168 hours. BNP (last 3 results) No results for input(s): PROBNP in the last 8760 hours. HbA1C: No results for input(s): HGBA1C in the last 72 hours. CBG: Recent Labs  Lab  12/28/17 1215 12/28/17 1652 12/28/17 2123 12/29/17 0736 12/29/17 1208  GLUCAP 213* 144* 206* 133* 179*   Lipid Profile: No results for input(s): CHOL, HDL, LDLCALC, TRIG, CHOLHDL, LDLDIRECT in the last 72 hours. Thyroid Function Tests: No results for input(s): TSH, T4TOTAL, FREET4, T3FREE, THYROIDAB in the last 72 hours. Anemia Panel: No results for input(s): VITAMINB12, FOLATE, FERRITIN, TIBC, IRON, RETICCTPCT in the last 72 hours. Sepsis Labs: Recent Labs  Lab 12/27/17 1854 12/28/17 0603  PROCALCITON  --  <0.10  LATICACIDVEN 1.80  --     Recent Results (from the past 240 hour(s))  Blood culture (routine x 2)     Status: None (Preliminary result)   Collection Time: 12/27/17  2:09 PM  Result Value Ref Range Status   Specimen Description   Final    BLOOD LEFT FOREARM Performed at [facility], [address] [place], [place] [address]    Special Requests   Final    BOTTLES DRAWN AEROBIC AND ANAEROBIC Blood Culture adequate volume Performed at [facility], [address], [place], [place] [address]    Culture   Final    NO GROWTH < 24 HOURS Performed at [facility], [address] [place], [place] [address]    Report Status PENDING  Incomplete  Blood culture (routine x 2)     Status: None (Preliminary result)   Collection Time: 12/27/17  2:22 PM  Result Value Ref Range Status   Specimen Description   Final    BLOOD RIGHT FOREARM Performed at [facility], [address], [place], [place] [address]    Special Requests   Final    BOTTLES DRAWN AEROBIC AND ANAEROBIC Blood Culture adequate volume Performed at [facility], [address], [place], [place] [address]    Culture   Final    NO GROWTH < 24 HOURS Performed at [facility], [address], [place], [place] [address]    Report Status PENDING  Incomplete  Urine culture     Status: None   Collection Time: 12/27/17   7:16 PM  Result Value Ref Range Status   Specimen Description   Final    URINE, RANDOM Performed at [facility], [address], [place], [place] [address]    Special Requests   Final    NONE Performed at [facility], [address], [place], [place] [address]    Culture   Final    NO GROWTH Performed at [facility], [address] [place], [place] [address]    Report Status 12/29/2017 FINAL  Final  Culture, sputum-assessment     Status: None  Collection Time: 12/27/17  8:02 PM  Result Value Ref Range Status   Specimen Description EXPECTORATED SPUTUM  Final   Special Requests NONE  Final   Sputum evaluation   Final    Sputum specimen not acceptable for testing.  Please recollect.    NOTIFIED BNKE AT 2240 ON 12/27/17 Performed at [facility], [address] [place], [place] [address]    Report Status 12/27/2017 FINAL  Final  MRSA PCR Screening     Status: None   Collection Time: 12/27/17  8:28 PM  Result Value Ref Range Status   MRSA by PCR NEGATIVE NEGATIVE Final    Comment:        The GeneXpert MRSA Assay (FDA approved for NASAL specimens only), is one component of a comprehensive MRSA colonization surveillance program. It is not intended to diagnose MRSA infection nor to guide or monitor treatment for MRSA infections. Performed at [facility], [address] [place], [place] [address]          Radiology Studies: Ct Abdomen Pelvis W Contrast  Result Date: 12/27/2017 CLINICAL DATA:  37 y/o  M; cough, fatigue, generalized body aches. EXAM: CT ABDOMEN AND PELVIS WITH CONTRAST TECHNIQUE: Multidetector CT imaging of the abdomen and pelvis was performed using the standard protocol following bolus administration of intravenous contrast. CONTRAST:  ISOVUE-300 IOPAMIDOL (ISOVUE-300) INJECTION 61% COMPARISON:  12/21/2017 CT abdomen and pelvis. FINDINGS: Lower chest: Increased small  bilateral pleural effusions and persistent consolidations within the lung bases. Hepatobiliary: No focal liver abnormality is seen. No gallstones, gallbladder wall thickening, or biliary dilatation. Pancreas: Unremarkable. No pancreatic ductal dilatation or surrounding inflammatory changes. Spleen: Collection along the posteromedial margin of the spleen is decreased in size measuring 6.5 x 4.4 cm (series 2, image 39). Trace residual collection along the anterior superior margin of the spleen (series 2, image 25). Adrenals/Urinary Tract: Adrenal glands are unremarkable. Kidneys are normal, without renal calculi, focal lesion, or hydronephrosis. Minimal bladder wall thickening, decreased from the prior study. Stomach/Bowel: Stomach is within normal limits. Appendectomy. No evidence of bowel wall thickening, distention, or inflammatory changes. Vascular/Lymphatic: No significant vascular findings are present. No enlarged abdominal or pelvic lymph nodes. Reproductive: Prostate is unremarkable. Other: No abdominal wall hernia or abnormality. No abdominopelvic ascites. Musculoskeletal: Stable probable hematoma in the right flank subcutaneous fat measuring up to 10 cm. No acute fracture. IMPRESSION: 1. Increased small bilateral pleural effusions and persistent small consolidations in the lung bases which may represent atelectasis or pneumonia. 2. Decreased size of perisplenic fluid collections. 3. Decreased bladder wall thickening and edema in the pelvis. 4. Stable hematoma in the right flank subcutaneous fat. Electronically Signed   By: Mitzi Hansen M.D.   On: 12/27/2017 17:32        Scheduled Meds: . fluticasone  2 spray Each Nare Daily  . insulin aspart  0-5 Units Subcutaneous QHS  . insulin aspart  0-9 Units Subcutaneous TID WC  . loratadine  10 mg Oral Daily  . nicotine  21 mg Transdermal Once  . nicotine  21 mg Transdermal Q24H  . sodium chloride flush  3 mL Intravenous Q12H   Continuous  Infusions: . sodium chloride    .  ceFAZolin (ANCEF) IV 2 g (12/29/17 1511)     LOS: 1 day    Time spent: 40 minutes    Ramiro Harvest, MD [facility] Pager [phone]  If 7PM-7AM, please contact night-coverage [email or web address] Password [facility] 12/29/2017, 5:06 PM

## 2017-12-29 NOTE — Progress Notes (Signed)
Dr [name] called regarding need for TEE for bacteremia No slots available Monday Will write orders for Tuesday and make NPO Tuesday am Will need to talk with Dr [name] on arrival to endo at that time  [facility] [name]

## 2017-12-30 LAB — GLUCOSE, CAPILLARY
GLUCOSE-CAPILLARY: 115 mg/dL — AB (ref 70–99)
GLUCOSE-CAPILLARY: 167 mg/dL — AB (ref 70–99)
Glucose-Capillary: 230 mg/dL — ABNORMAL HIGH (ref 70–99)
Glucose-Capillary: 230 mg/dL — ABNORMAL HIGH (ref 70–99)

## 2017-12-30 MED ORDER — SODIUM CHLORIDE 0.9 % IV SOLN
INTRAVENOUS | Status: DC
  Administered 2017-12-30 – 2017-12-31 (×2): via INTRAVENOUS

## 2017-12-30 MED ORDER — MAGNESIUM SULFATE 4 GM/100ML IV SOLN
4.0000 g | Freq: Once | INTRAVENOUS | Status: AC
  Administered 2017-12-30: 4 g via INTRAVENOUS
  Filled 2017-12-30: qty 100

## 2017-12-30 NOTE — Progress Notes (Signed)
PROGRESS NOTE    [name] [name]  XBJ:[id number] DOB: 02/27/1981 DOA: 12/27/2017 PCP: Patient, No Pcp Per   Brief Narrative:  [name] [name] is a 37 y.o. male with medical history significant for polysubstance abuse, type 2 diabetes mellitus, and recent admission with acute appendicitis and sepsis with MSSA bacteremia, now presenting to the emergency department for evaluation of productive cough, shortness of breath, general malaise, and aches.  Patient was admitted to an outside hospital on 12/13/2017 with sepsis secondary to acute appendicitis, underwent laparoscopic appendectomy, MSSA grew from peritoneal fluid and blood cultures, he was noted to have splenic fluid collections that were favored to represent hematoma or seroma rather than abscess, was seen by ID and transition from empiric broad-spectrum antibiotics to cefazolin, doxycycline was later added for pneumonia, and the patient left AMA on 12/21/2017 prior to completing treatment. Blood cultures from 12/14/17 remained negative. He had and echo during recent admit that was negative for vegetation.  Since leaving, he reports that his productive cough has gradually worsened and he has developed progressive fatigue and general malaise.  He denies chest pain.  He has some mild abdominal pain that has improved significantly from the recent hospitalization.  He denies fevers.  Denies recent alcohol or illicit substance use.  ED Course: Upon arrival to the ED, patient is found to be afebrile, saturating adequately on room air, and hypertensive.  Chemistry panel is notable for an albumin of 2.9 and CBC features a mild thrombocytosis.  Lactic acid is reassuringly normal.  UDS is negative.  Lipase is mildly elevated.  Urinalysis is unremarkable.  Blood and urine cultures were collected, 2 L of normal saline administered, and the patient was treated with vancomycin and cefepime in the ED.  Given his recent MSSA bacteremia for which he may not have completed  treatment prior to leaving AMA, and now with worsening systemic symptoms, hospitalist were asked to admit for ongoing evaluation and management.   Assessment & Plan:   Principal Problem:   HCAP (healthcare-associated pneumonia) Active Problems:   Diabetes mellitus type II, non insulin dependent (HCC)   Polysubstance abuse (HCC)   Splenic lesion   MSSA bacteremia  #1 Healthcare associated pneumonia Patient presented with productive cough, shortness of breath, fatigue and malaise.  Patient during last hospitalization was being treated for pneumonia however left AMA.  Patient currently afebrile.  Normal white count.  CT abdomen and pelvis with con consolidations in the bases to reflect either pneumonia or atelectasis.  However patient with productive cough and malaise and as such being treated as a pneumonia.  Blood cultures pending.  Sputum Gram stain and culture pending.  MRSA PCR negative.  Urine Legionella antigen negative, urine strep pneumococcus antigen negative.  Patient initially was on IV vancomycin IV cefepime and antibiotics have been narrowed down to IV Ancef.  Continue Claritin and Flonase.  TEE pending.  Patient seen in consultation by ID and following.    2.  MSSA bacteremia From outside hospital blood and peritoneal fluid cultures from 12/13/2017 were positive for MSSA in the setting of an acute appendicitis.  Patient noted to have a 2D echo done which was negative for vegetations. It was noted that patient was seen in consultation by ID and was transitioned from empiric broad-spectrum antibiotics to cefazolin on 12/16/2017 however patient left AMA on 12/21/2017.  Repeat cultures from 12/14/2017 were negative.  Blood cultures were ordered on admission and currently pending.  IV cefepime and IV vancomycin have been discontinued.  Patient on  IV Ancef per ID recommendations.  ID recommending patient undergo a TEE.  Cardiology consulted for TEE and patient for TEE tomorrow 12/31/2017.  ID  following and appreciate input and recommendations.  3.  Type 2 diabetes mellitus Patient was on oral hypoglycemic agents at home which are currently on hold.  CBG 167 this morning.  Hemoglobin A1c pending.  Continue sliding scale insulin.   4.  Splenic fluid collection During recent admission was noted that surgeon felt likely a seroma or hematoma rather than an abscess.  Blood cultures have been repeated during this admission and are currently pending.  CT abdomen and pelvis which was done on admission with improvement with splenic fluid collections.  Monitor.  5.  Polysubstance abuse UDS negative on admission.  No signs of withdrawal noted.  If patient with signs of withdrawal will need to place on the clonidine detox protocol.  Follow for now.   DVT prophylaxis: SCDs Code Status: Full Family Communication: Updated patient.  No family at bedside.  Disposition Plan: Home when medically improved clinically stable and when okay with ID.   Consultants:   Infectious disease: Dr.Comer  Procedures:   CT abdomen and pelvis 12/27/2017  Chest x-ray 12/27/2017  TEE pending for 12/31/2017  Antimicrobials:   IV cefepime 12/27/2017>>>> 12/28/2017  IV vancomycin 12/27/2017>>>> 12/28/2017  IV cefazolin 12/28/2017   Subjective: Patient sleeping however easily arousable.  Denies any chest pain or shortness of breath.  Cough improving.     Objective: Vitals:   12/29/17 0521 12/29/17 1838 12/29/17 2054 12/30/17 0434  BP: (!) 138/100 138/90 125/88 128/87  Pulse: 93 (!) 101 90 79  Resp: 18 20 16 20   Temp: 98.2 F (36.8 C) 98.2 F (36.8 C) 98.3 F (36.8 C) 98.3 F (36.8 C)  TempSrc:  Oral    SpO2: 92% 96% 98% 99%  Weight:      Height:        Intake/Output Summary (Last 24 hours) at 12/30/2017 1059 Last data filed at 12/30/2017 0903 Gross per 24 hour  Intake 480 ml  Output 2300 ml  Net -1820 ml   Filed Weights   12/27/17 1811  Weight: 127 kg    Examination:  General exam:  NAD Respiratory system: Clear to auscultation bilaterally anterior lung fields.  No wheezes, no crackles, no rhonchi.   Cardiovascular system: RRR no murmurs rubs or gallops.  No JVD.  No lower extremity edema.   Gastrointestinal system: Abdomen is nontender, nondistended, soft, positive bowel sounds.  No rebound.  No guarding.  Central nervous system: Alert and oriented. No focal neurological deficits. Extremities: Symmetric 5 x 5 power. Skin: Lower extremity with chronic venous stasis changes noted.  Psychiatry: Judgement and insight appear fair. Mood & affect appropriate.     Data Reviewed: I have personally reviewed following labs and imaging studies  CBC: Recent Labs  Lab 12/27/17 1408 12/28/17 0603 12/29/17 0532  WBC 7.3 6.1 6.1  NEUTROABS 4.6 3.6 3.8  HGB 12.9* 11.2* 10.6*  HCT 39.3 34.2* 32.1*  MCV 84.0 84.2 83.2  PLT 416* 354 326   Basic Metabolic Panel: Recent Labs  Lab 12/27/17 1408 12/28/17 0603 12/29/17 0532  NA 141 140 140  K 4.0 4.0 3.9  CL 102 104 106  CO2 30 27 28   GLUCOSE 177* 157* 156*  BUN 5* 10 11  CREATININE 0.61 0.71 0.72  CALCIUM 8.8* 8.5* 8.6*  MG  --   --  1.6*   GFR: Estimated Creatinine Clearance: 178 mL/min (  by C-G formula based on SCr of 0.72 mg/dL). Liver Function Tests: Recent Labs  Lab 12/27/17 1408  AST 34  ALT 29  ALKPHOS 81  BILITOT 0.3  PROT 8.5*  ALBUMIN 2.9*   Recent Labs  Lab 12/27/17 1408  LIPASE 270*   No results for input(s): AMMONIA in the last 168 hours. Coagulation Profile: No results for input(s): INR, PROTIME in the last 168 hours. Cardiac Enzymes: No results for input(s): CKTOTAL, CKMB, CKMBINDEX, TROPONINI in the last 168 hours. BNP (last 3 results) No results for input(s): PROBNP in the last 8760 hours. HbA1C: No results for input(s): HGBA1C in the last 72 hours. CBG: Recent Labs  Lab 12/29/17 0736 12/29/17 1208 12/29/17 1729 12/29/17 2056 12/30/17 0749  GLUCAP 133* 179* 106* 153* 115*    Lipid Profile: No results for input(s): CHOL, HDL, LDLCALC, TRIG, CHOLHDL, LDLDIRECT in the last 72 hours. Thyroid Function Tests: No results for input(s): TSH, T4TOTAL, FREET4, T3FREE, THYROIDAB in the last 72 hours. Anemia Panel: No results for input(s): VITAMINB12, FOLATE, FERRITIN, TIBC, IRON, RETICCTPCT in the last 72 hours. Sepsis Labs: Recent Labs  Lab 12/27/17 1854 12/28/17 0603  PROCALCITON  --  <0.10  LATICACIDVEN 1.80  --     Recent Results (from the past 240 hour(s))  Blood culture (routine x 2)     Status: None (Preliminary result)   Collection Time: 12/27/17  2:09 PM  Result Value Ref Range Status   Specimen Description   Final    BLOOD LEFT FOREARM Performed at [facility], [address], [place], [place] [address]    Special Requests   Final    BOTTLES DRAWN AEROBIC AND ANAEROBIC Blood Culture adequate volume Performed at [facility], [address], [place], [place] [address]    Culture   Final    NO GROWTH 3 DAYS Performed at [facility], [address], [place], [place] [address]    Report Status PENDING  Incomplete  Blood culture (routine x 2)     Status: None (Preliminary result)   Collection Time: 12/27/17  2:22 PM  Result Value Ref Range Status   Specimen Description   Final    BLOOD RIGHT FOREARM Performed at [facility], [address], [place], [place] [address]    Special Requests   Final    BOTTLES DRAWN AEROBIC AND ANAEROBIC Blood Culture adequate volume Performed at [facility], [address], [place], [place] [address]    Culture   Final    NO GROWTH 3 DAYS Performed at [facility], [address] [place], [place] [address]    Report Status PENDING  Incomplete  Urine culture     Status: None   Collection Time: 12/27/17  7:16 PM  Result Value Ref Range Status   Specimen Description   Final    URINE, RANDOM Performed at [facility], [address] [place], [place] [address]    Special Requests   Final    NONE Performed at [facility], [address], [place], [place] [address]    Culture   Final    NO GROWTH Performed at [facility], [address], [place], [place] [address]    Report Status 12/29/2017 FINAL  Final  Culture, sputum-assessment     Status: None   Collection Time: 12/27/17  8:02 PM  Result Value Ref Range Status   Specimen Description EXPECTORATED  SPUTUM  Final   Special Requests NONE  Final   Sputum evaluation   Final    Sputum specimen not acceptable for testing.  Please recollect.    NOTIFIED BNKE AT 2240 ON 12/27/17 Performed at [facility], [address] [place], [place] [address]    Report Status 12/27/2017 FINAL  Final  MRSA PCR Screening     Status: None   Collection Time: 12/27/17  8:28 PM  Result Value Ref Range Status   MRSA by PCR NEGATIVE NEGATIVE Final    Comment:        The GeneXpert MRSA Assay (FDA approved for NASAL specimens only), is one component of a comprehensive MRSA colonization surveillance program. It is not intended to diagnose MRSA infection nor to guide or monitor treatment for MRSA infections. Performed at [facility], [address] [place], [place] [address]          Radiology Studies: No results found.      Scheduled Meds: . fluticasone  2 spray Each Nare Daily  . insulin aspart  0-5 Units Subcutaneous QHS  . insulin aspart  0-9 Units Subcutaneous TID WC  . loratadine  10 mg Oral Daily  . nicotine  21 mg Transdermal Once  . nicotine  21 mg Transdermal Q24H  . sodium chloride flush  3 mL Intravenous Q12H   Continuous Infusions: . sodium chloride    . sodium chloride    .  ceFAZolin (ANCEF) IV 2 g (12/30/17 0518)  . magnesium sulfate 1 - 4 g bolus IVPB       LOS: 2 days    Time spent: 35 minutes    Ramiro Harvestaniel Horace Wishon, MD [facility] Pager [phone]  If 7PM-7AM, please contact night-coverage [email or web address] Password [email or web address] 12/30/2017, 10:59 AM

## 2017-12-30 NOTE — Progress Notes (Signed)
    [facility] has been requested to perform a transesophageal echocardiogram on this patient for bacteremia.  After careful review of history and examination, the risks and benefits of transesophageal echocardiogram have been explained including risks of esophageal damage, perforation (1:10,000 risk), bleeding, pharyngeal hematoma as well as other potential complications associated with conscious sedation including aspiration, arrhythmia, respiratory failure and death. Alternatives to treatment were discussed, questions were answered. Patient is willing to proceed.   This is scheduled for tomorrow with Dr. [name] with anesthesia.   Lennon AlstromJacquelyn D Dominque Marlin, PA-C 12/30/2017 11:04 AM

## 2017-12-30 NOTE — Progress Notes (Signed)
Spoke with Maisie Fushomas at [facility] who is aware that the patient will need to come to [facility] for a TEE on 12/31/17. Pt to arrive around 1000. Pt's nurse, Jenkins RougeNakiyha, made aware as well. Weston SettleBethany Lillian Ballester, RN

## 2017-12-30 NOTE — Care Management Note (Signed)
Case Management Note  Patient Details  Name: Electa Sniffric Sainato MRN: [id number] Date of Birth: [birth year]  Subjective/Objective:                  37 y.o.malewith medical history significant forpolysubstance abuse, type 2 diabetes mellitus, and recent admission with acute appendicitis and sepsis with MSSA bacteremia, now presenting to the emergency department for evaluation of productive cough, shortness of breath, general malaise, and aches. Patient was admitted to an outside hospital on 12/13/2017 with sepsis secondary to acute appendicitis, underwent laparoscopic appendectomy, MSSA grew from peritoneal fluid and blood cultures, he was noted to have splenic fluid collections that were favored to represent hematoma or seroma rather than abscess, was seen by ID and transition from empiric broad-spectrum antibiotics to cefazolin, doxycycline was later added for pneumonia, and the patient left AMA on 12/21/2017 prior to completing treatment. Blood cultures from 12/14/17 remained negative. He had and echo during recent admit that was negative for vegetation.Since leaving, he reports that his productive cough has gradually worsened and he has developed progressive fatigue and general malaise. He denies chest pain. He has some mild abdominal pain that has improved significantly from the recent hospitalization. He denies fevers. Denies recent alcohol or illicit substance use.  ED Course:Upon arrival to the ED, patient is found to be afebrile, saturating adequately on room air, and hypertensive. Chemistry panel is notable for an albumin of 2.9 and CBC features a mild thrombocytosis. Lactic acid is reassuringly normal. UDS is negative. Lipase is mildly elevated. Urinalysis is unremarkable. Blood and urine cultures were collected, 2 L of normal saline administered, and the patient was treated with vancomycin and cefepime in the ED. Given his recent MSSA bacteremia for which he may not have completed  treatment prior to leaving AMA, and now with worsening systemic symptoms, hospitalist were asked to admit for ongoing evaluation and management.  Action/Plan: Following for progression of HPNA/DM./polysubstance abuse csw referral for substance abuse resources Will follow for cm needs  Expected Discharge Date:                  Expected Discharge Plan:  Home/Self Care  In-House Referral:  Clinical Social Work  Discharge planning Services  CM Consult  Post Acute Care Choice:    Choice offered to:     DME Arranged:    DME Agency:     HH Arranged:    HH Agency:     Status of Service:  In process, will continue to follow  If discussed at Long Length of Stay Meetings, dates discussed:    Additional Comments:  Golda AcreDavis, Lorene Klimas Lynn, RN 12/30/2017, 11:47 AM

## 2017-12-30 NOTE — H&P (View-Only) (Signed)
PROGRESS NOTE    Doctor Sheahan  ZOX:[id number] DOB: July 09, 1980 DOA: 12/27/2017 PCP: Patient, No Pcp Per   Brief Narrative:  [name] [name] is a 37 y.o. male with medical history significant for polysubstance abuse, type 2 diabetes mellitus, and recent admission with acute appendicitis and sepsis with MSSA bacteremia, now presenting to the emergency department for evaluation of productive cough, shortness of breath, general malaise, and aches.  Patient was admitted to an outside hospital on 12/13/2017 with sepsis secondary to acute appendicitis, underwent laparoscopic appendectomy, MSSA grew from peritoneal fluid and blood cultures, he was noted to have splenic fluid collections that were favored to represent hematoma or seroma rather than abscess, was seen by ID and transition from empiric broad-spectrum antibiotics to cefazolin, doxycycline was later added for pneumonia, and the patient left AMA on 12/21/2017 prior to completing treatment. Blood cultures from 12/14/17 remained negative. He had and echo during recent admit that was negative for vegetation.  Since leaving, he reports that his productive cough has gradually worsened and he has developed progressive fatigue and general malaise.  He denies chest pain.  He has some mild abdominal pain that has improved significantly from the recent hospitalization.  He denies fevers.  Denies recent alcohol or illicit substance use.  ED Course: Upon arrival to the ED, patient is found to be afebrile, saturating adequately on room air, and hypertensive.  Chemistry panel is notable for an albumin of 2.9 and CBC features a mild thrombocytosis.  Lactic acid is reassuringly normal.  UDS is negative.  Lipase is mildly elevated.  Urinalysis is unremarkable.  Blood and urine cultures were collected, 2 L of normal saline administered, and the patient was treated with vancomycin and cefepime in the ED.  Given his recent MSSA bacteremia for which he may not have completed  treatment prior to leaving AMA, and now with worsening systemic symptoms, hospitalist were asked to admit for ongoing evaluation and management.   Assessment & Plan:   Principal Problem:   HCAP (healthcare-associated pneumonia) Active Problems:   Diabetes mellitus type II, non insulin dependent (HCC)   Polysubstance abuse (HCC)   Splenic lesion   MSSA bacteremia  #1 Healthcare associated pneumonia Patient presented with productive cough, shortness of breath, fatigue and malaise.  Patient during last hospitalization was being treated for pneumonia however left AMA.  Patient currently afebrile.  Normal white count.  CT abdomen and pelvis with con consolidations in the bases to reflect either pneumonia or atelectasis.  However patient with productive cough and malaise and as such being treated as a pneumonia.  Blood cultures pending.  Sputum Gram stain and culture pending.  MRSA PCR negative.  Urine Legionella antigen negative, urine strep pneumococcus antigen negative.  Patient initially was on IV vancomycin IV cefepime and antibiotics have been narrowed down to IV Ancef.  Continue Claritin and Flonase.  TEE pending.  Patient seen in consultation by ID and following.    2.  MSSA bacteremia From outside hospital blood and peritoneal fluid cultures from 12/13/2017 were positive for MSSA in the setting of an acute appendicitis.  Patient noted to have a 2D echo done which was negative for vegetations. It was noted that patient was seen in consultation by ID and was transitioned from empiric broad-spectrum antibiotics to cefazolin on 12/16/2017 however patient left AMA on 12/21/2017.  Repeat cultures from 12/14/2017 were negative.  Blood cultures were ordered on admission and currently pending.  IV cefepime and IV vancomycin have been discontinued.  Patient on  IV Ancef per ID recommendations.  ID recommending patient undergo a TEE.  Cardiology consulted for TEE and patient for TEE tomorrow 12/31/2017.  ID  following and appreciate input and recommendations.  3.  Type 2 diabetes mellitus Patient was on oral hypoglycemic agents at home which are currently on hold.  CBG 167 this morning.  Hemoglobin A1c pending.  Continue sliding scale insulin.   4.  Splenic fluid collection During recent admission was noted that surgeon felt likely a seroma or hematoma rather than an abscess.  Blood cultures have been repeated during this admission and are currently pending.  CT abdomen and pelvis which was done on admission with improvement with splenic fluid collections.  Monitor.  5.  Polysubstance abuse UDS negative on admission.  No signs of withdrawal noted.  If patient with signs of withdrawal will need to place on the clonidine detox protocol.  Follow for now.   DVT prophylaxis: SCDs Code Status: Full Family Communication: Updated patient.  No family at bedside.  Disposition Plan: Home when medically improved clinically stable and when okay with ID.   Consultants:   Infectious disease: Dr.Comer  Procedures:   CT abdomen and pelvis 12/27/2017  Chest x-ray 12/27/2017  TEE pending for 12/31/2017  Antimicrobials:   IV cefepime 12/27/2017>>>> 12/28/2017  IV vancomycin 12/27/2017>>>> 12/28/2017  IV cefazolin 12/28/2017   Subjective: Patient sleeping however easily arousable.  Denies any chest pain or shortness of breath.  Cough improving.     Objective: Vitals:   12/29/17 0521 12/29/17 1838 12/29/17 2054 12/30/17 0434  BP: (!) 138/100 138/90 125/88 128/87  Pulse: 93 (!) 101 90 79  Resp: 18 20 16 20   Temp: 98.2 F (36.8 C) 98.2 F (36.8 C) 98.3 F (36.8 C) 98.3 F (36.8 C)  TempSrc:  Oral    SpO2: 92% 96% 98% 99%  Weight:      Height:        Intake/Output Summary (Last 24 hours) at 12/30/2017 1059 Last data filed at 12/30/2017 0903 Gross per 24 hour  Intake 480 ml  Output 2300 ml  Net -1820 ml   Filed Weights   12/27/17 1811  Weight: 127 kg    Examination:  General exam:  NAD Respiratory system: Clear to auscultation bilaterally anterior lung fields.  No wheezes, no crackles, no rhonchi.   Cardiovascular system: RRR no murmurs rubs or gallops.  No JVD.  No lower extremity edema.   Gastrointestinal system: Abdomen is nontender, nondistended, soft, positive bowel sounds.  No rebound.  No guarding.  Central nervous system: Alert and oriented. No focal neurological deficits. Extremities: Symmetric 5 x 5 power. Skin: Lower extremity with chronic venous stasis changes noted.  Psychiatry: Judgement and insight appear fair. Mood & affect appropriate.     Data Reviewed: I have personally reviewed following labs and imaging studies  CBC: Recent Labs  Lab 12/27/17 1408 12/28/17 0603 12/29/17 0532  WBC 7.3 6.1 6.1  NEUTROABS 4.6 3.6 3.8  HGB 12.9* 11.2* 10.6*  HCT 39.3 34.2* 32.1*  MCV 84.0 84.2 83.2  PLT 416* 354 326   Basic Metabolic Panel: Recent Labs  Lab 12/27/17 1408 12/28/17 0603 12/29/17 0532  NA 141 140 140  K 4.0 4.0 3.9  CL 102 104 106  CO2 30 27 28   GLUCOSE 177* 157* 156*  BUN 5* 10 11  CREATININE 0.61 0.71 0.72  CALCIUM 8.8* 8.5* 8.6*  MG  --   --  1.6*   GFR: Estimated Creatinine Clearance: 178 mL/min (  by C-G formula based on SCr of 0.72 mg/dL). Liver Function Tests: Recent Labs  Lab 12/27/17 1408  AST 34  ALT 29  ALKPHOS 81  BILITOT 0.3  PROT 8.5*  ALBUMIN 2.9*   Recent Labs  Lab 12/27/17 1408  LIPASE 270*   No results for input(s): AMMONIA in the last 168 hours. Coagulation Profile: No results for input(s): INR, PROTIME in the last 168 hours. Cardiac Enzymes: No results for input(s): CKTOTAL, CKMB, CKMBINDEX, TROPONINI in the last 168 hours. BNP (last 3 results) No results for input(s): PROBNP in the last 8760 hours. HbA1C: No results for input(s): HGBA1C in the last 72 hours. CBG: Recent Labs  Lab 12/29/17 0736 12/29/17 1208 12/29/17 1729 12/29/17 2056 12/30/17 0749  GLUCAP 133* 179* 106* 153* 115*    Lipid Profile: No results for input(s): CHOL, HDL, LDLCALC, TRIG, CHOLHDL, LDLDIRECT in the last 72 hours. Thyroid Function Tests: No results for input(s): TSH, T4TOTAL, FREET4, T3FREE, THYROIDAB in the last 72 hours. Anemia Panel: No results for input(s): VITAMINB12, FOLATE, FERRITIN, TIBC, IRON, RETICCTPCT in the last 72 hours. Sepsis Labs: Recent Labs  Lab 12/27/17 1854 12/28/17 0603  PROCALCITON  --  <0.10  LATICACIDVEN 1.80  --     Recent Results (from the past 240 hour(s))  Blood culture (routine x 2)     Status: None (Preliminary result)   Collection Time: 12/27/17  2:09 PM  Result Value Ref Range Status   Specimen Description   Final    BLOOD LEFT FOREARM Performed at [facility], [address], [place], [place] [address]    Special Requests   Final    BOTTLES DRAWN AEROBIC AND ANAEROBIC Blood Culture adequate volume Performed at [facility], [address], [place], [place] [address]    Culture   Final    NO GROWTH 3 DAYS Performed at [facility], [address], [place], [place] [address]    Report Status PENDING  Incomplete  Blood culture (routine x 2)     Status: None (Preliminary result)   Collection Time: 12/27/17  2:22 PM  Result Value Ref Range Status   Specimen Description   Final    BLOOD RIGHT FOREARM Performed at [facility], [address], [place], [place] [address]    Special Requests   Final    BOTTLES DRAWN AEROBIC AND ANAEROBIC Blood Culture adequate volume Performed at [facility], [address] [place], [place] [address]    Culture   Final    NO GROWTH 3 DAYS Performed at [facility], [address], [place], [place] [address]    Report Status PENDING  Incomplete  Urine culture     Status: None   Collection Time: 12/27/17  7:16 PM  Result Value Ref Range Status   Specimen Description   Final    URINE, RANDOM Performed at [facility], [address], [place], [place] [address]    Special Requests   Final    NONE Performed at [facility], [address] [place], [place] [address]    Culture   Final    NO GROWTH Performed at [facility], [address] [place], [place] [address]    Report Status 12/29/2017 FINAL  Final  Culture, sputum-assessment     Status: None   Collection Time: 12/27/17  8:02 PM  Result Value Ref Range Status   Specimen Description EXPECTORATED  SPUTUM  Final   Special Requests NONE  Final   Sputum evaluation   Final    Sputum specimen not acceptable for testing.  Please recollect.    NOTIFIED BNKE AT 2240 ON 12/27/17 Performed at [facility], [address] [place], [place] [address]    Report Status 12/27/2017 FINAL  Final  MRSA PCR Screening     Status: None   Collection Time: 12/27/17  8:28 PM  Result Value Ref Range Status   MRSA by PCR NEGATIVE NEGATIVE Final    Comment:        The GeneXpert MRSA Assay (FDA approved for NASAL specimens only), is one component of a comprehensive MRSA colonization surveillance program. It is not intended to diagnose MRSA infection nor to guide or monitor treatment for MRSA infections. Performed at [facility], [address] [place], [place] [address]          Radiology Studies: No results found.      Scheduled Meds: . fluticasone  2 spray Each Nare Daily  . insulin aspart  0-5 Units Subcutaneous QHS  . insulin aspart  0-9 Units Subcutaneous TID WC  . loratadine  10 mg Oral Daily  . nicotine  21 mg Transdermal Once  . nicotine  21 mg Transdermal Q24H  . sodium chloride flush  3 mL Intravenous Q12H   Continuous Infusions: . sodium chloride    . sodium chloride    .  ceFAZolin (ANCEF) IV 2 g (12/30/17 0518)  . magnesium sulfate 1 - 4 g bolus IVPB       LOS: 2 days    Time spent: 35 minutes    [name] Harvestaniel Carrie Usery, MD [facility] Pager [phone]  If 7PM-7AM, please contact night-coverage [email or web address] Password [email or web address] 12/30/2017, 10:59 AM

## 2017-12-31 ENCOUNTER — Encounter (HOSPITAL_COMMUNITY)
Admission: EM | Disposition: A | Discharge status: Home / Self Care | Payer: Self-pay | Source: Home / Self Care | Attending: Internal Medicine

## 2017-12-31 ENCOUNTER — Inpatient Hospital Stay (HOSPITAL_COMMUNITY): Payer: Self-pay | Admitting: Certified Registered"

## 2017-12-31 ENCOUNTER — Inpatient Hospital Stay (HOSPITAL_COMMUNITY): Payer: Self-pay

## 2017-12-31 ENCOUNTER — Encounter (HOSPITAL_COMMUNITY): Payer: Self-pay | Admitting: Certified Registered Nurse Anesthetist

## 2017-12-31 DIAGNOSIS — R7881 Bacteremia: Secondary | ICD-10-CM

## 2017-12-31 HISTORY — PX: TEE WITHOUT CARDIOVERSION: SHX5443

## 2017-12-31 LAB — CBC
HEMATOCRIT: 34.5 % — AB (ref 39.0–52.0)
HEMOGLOBIN: 11.4 g/dL — AB (ref 13.0–17.0)
MCH: 27.7 pg (ref 26.0–34.0)
MCHC: 33 g/dL (ref 30.0–36.0)
MCV: 83.7 fL (ref 78.0–100.0)
Platelets: 298 10*3/uL (ref 150–400)
RBC: 4.12 MIL/uL — ABNORMAL LOW (ref 4.22–5.81)
RDW: 14.2 % (ref 11.5–15.5)
WBC: 5.9 10*3/uL (ref 4.0–10.5)

## 2017-12-31 LAB — BASIC METABOLIC PANEL
Anion gap: 8 (ref 5–15)
BUN: 9 mg/dL (ref 6–20)
CHLORIDE: 108 mmol/L (ref 98–111)
CO2: 25 mmol/L (ref 22–32)
Calcium: 8.4 mg/dL — ABNORMAL LOW (ref 8.9–10.3)
Creatinine, Ser: 0.65 mg/dL (ref 0.61–1.24)
GFR calc Af Amer: >60 mL/min (ref >60)
GFR calc non Af Amer: >60 mL/min (ref >60)
GLUCOSE: 208 mg/dL — AB (ref 70–99)
Potassium: 4.2 mmol/L (ref 3.5–5.1)
Sodium: 141 mmol/L (ref 135–145)

## 2017-12-31 LAB — GLUCOSE, CAPILLARY
GLUCOSE-CAPILLARY: 184 mg/dL — AB (ref 70–99)
GLUCOSE-CAPILLARY: 129 mg/dL — AB (ref 70–99)
GLUCOSE-CAPILLARY: 183 mg/dL — AB (ref 70–99)
GLUCOSE-CAPILLARY: 158 mg/dL — AB (ref 70–99)
Glucose-Capillary: 155 mg/dL — ABNORMAL HIGH (ref 70–99)

## 2017-12-31 LAB — HEMOGLOBIN A1C
Hgb A1c MFr Bld: 8.4 % — ABNORMAL HIGH (ref 4.8–5.6)
Mean Plasma Glucose: 194.38 mg/dL

## 2017-12-31 LAB — MAGNESIUM: Magnesium: 1.8 mg/dL (ref 1.7–2.4)

## 2017-12-31 SURGERY — ECHOCARDIOGRAM, TRANSESOPHAGEAL
Anesthesia: Monitor Anesthesia Care

## 2017-12-31 MED ORDER — MIDAZOLAM HCL 5 MG/5ML IJ SOLN
INTRAMUSCULAR | Status: DC | PRN
  Administered 2017-12-31: 2 mg via INTRAVENOUS

## 2017-12-31 MED ORDER — MAGNESIUM SULFATE 2 GM/50ML IV SOLN
2.0000 g | Freq: Once | INTRAVENOUS | Status: AC
  Administered 2017-12-31: 2 g via INTRAVENOUS
  Filled 2017-12-31: qty 50

## 2017-12-31 MED ORDER — LINEZOLID 600 MG PO TABS: 600.0000 mg | ORAL_TABLET | Freq: Two times a day (BID) | ORAL | 0 refills | Status: DC

## 2017-12-31 MED ORDER — CEFAZOLIN SODIUM-DEXTROSE 2-4 GM/100ML-% IV SOLN
2.0000 g | Freq: Three times a day (TID) | INTRAVENOUS | Status: DC
  Administered 2017-12-31 – 2018-01-01 (×2): 2 g via INTRAVENOUS
  Filled 2017-12-31 (×3): qty 100

## 2017-12-31 MED ORDER — LIDOCAINE 2% (20 MG/ML) 5 ML SYRINGE
INTRAMUSCULAR | Status: DC | PRN
  Administered 2017-12-31: 100 mg via INTRAVENOUS

## 2017-12-31 MED ORDER — MIDAZOLAM HCL 2 MG/2ML IJ SOLN
INTRAMUSCULAR | Status: AC
  Filled 2017-12-31: qty 2

## 2017-12-31 MED ORDER — PROPOFOL 500 MG/50ML IV EMUL
INTRAVENOUS | Status: DC | PRN
  Administered 2017-12-31: 125 ug/kg/min via INTRAVENOUS

## 2017-12-31 MED ORDER — PROPOFOL 10 MG/ML IV BOLUS
INTRAVENOUS | Status: DC | PRN
  Administered 2017-12-31: 20 mg via INTRAVENOUS
  Administered 2017-12-31: 10 mg via INTRAVENOUS
  Administered 2017-12-31: 20 mg via INTRAVENOUS
  Administered 2017-12-31: 30 mg via INTRAVENOUS
  Administered 2017-12-31: 20 mg via INTRAVENOUS

## 2017-12-31 MED ORDER — GABAPENTIN 100 MG PO CAPS
100.0000 mg | ORAL_CAPSULE | Freq: Three times a day (TID) | ORAL | Status: DC
  Filled 2017-12-31: qty 1

## 2017-12-31 NOTE — Progress Notes (Signed)
    [facility]    Date of Admission:  12/27/2017   Total days of antibiotics 5        Day 4 cefazolin           ID: [name] [name] is a 37 y.o. male with hx of complicated MSSA bacteremia in setting of appendicitis and intra-abdominal infection Principal Problem:   HCAP (healthcare-associated pneumonia) Active Problems:   Diabetes mellitus type II, non insulin dependent (HCC)   Polysubstance abuse (HCC)   Splenic lesion   MSSA bacteremia    Subjective: Afebrile; underwent TEE which was negative ( in review of his data at outside hospital, he had completed 7 days of abtx since documented negative cultures)  Medications:  . fluticasone  2 spray Each Nare Daily  . gabapentin  100 mg Oral TID  . insulin aspart  0-5 Units Subcutaneous QHS  . insulin aspart  0-9 Units Subcutaneous TID WC  . loratadine  10 mg Oral Daily  . nicotine  21 mg Transdermal Once  . nicotine  21 mg Transdermal Q24H  . sodium chloride flush  3 mL Intravenous Q12H    Objective: Vital signs in last 24 hours: Temp:  [97.5 F (36.4 C)-98.6 F (37 C)] 97.5 F (36.4 C) (08/27 1449) Pulse Rate:  [83-99] 99 (08/27 1449) Resp:  [13-23] 18 (08/27 1449) BP: (120-168)/(71-113) 120/81 (08/27 1449) SpO2:  [98 %-100 %] 100 % (08/27 1449) Physical Exam  Constitutional: He is oriented to person, place, and time. He appears well-developed and well-nourished. No distress.  HENT:  Mouth/Throat: Oropharynx is clear and moist. No oropharyngeal exudate.  Cardiovascular: Normal rate, regular rhythm and normal heart sounds. Exam reveals no gallop and no friction rub.  No murmur heard.  Pulmonary/Chest: Effort normal and breath sounds normal. No respiratory distress. He has no wheezes.  Abdominal: Soft. Bowel sounds are normal. He exhibits no distension. There is no tenderness.  Lymphadenopathy:  He has no cervical adenopathy.  Neurological: He is alert and oriented to person, place, and time.    Skin: Skin is warm and dry. No rash noted. No erythema.  Psychiatric: He has a normal mood and affect. His behavior is normal.     Lab Results Recent Labs    12/29/17 0532 12/31/17 0606  WBC 6.1 5.9  HGB 10.6* 11.4*  HCT 32.1* 34.5*  NA 140 141  K 3.9 4.2  CL 106 108  CO2 28 25  BUN 11 9  CREATININE 0.72 0.65    Microbiology: Blood cx NGTD Studies/Results: No results found.   Assessment/Plan: MSSA bacteremia, partially treated = he has now completed work up to see if he would need further prolonged antibiotic course. Since his TEE is negative, and he has finished 12 days of abtx (including what he received at outside hospital). I recommend that he finishes out 2 more days of abtx with linezolid 600mg  PO BID   Please see if the SW can arrange for 2 more days of linezolid (technically 3 pills - we can give first dose in the morning)   [facility]yn[facility] Anaalicia [name] Cell: [phone] Pager: [phone]  12/31/2017, 6:14 PM

## 2017-12-31 NOTE — Interval H&P Note (Signed)
History and Physical Interval Note:  12/31/2017 9:40 AM  [name] [name]  has presented today for surgery, with the diagnosis of BACTEREMIA  The various methods of treatment have been discussed with the patient and family. After consideration of risks, benefits and other options for treatment, the patient has consented to  Procedure(s): TRANSESOPHAGEAL ECHOCARDIOGRAM (TEE) (N/A) as a surgical intervention .  The patient's history has been reviewed, patient examined, no change in status, stable for surgery.  I have reviewed the patient's chart and labs.  Questions were answered to the patient's satisfaction.     Olga MillersBrian Candas Deemer

## 2017-12-31 NOTE — Progress Notes (Signed)
PROGRESS NOTE    [name] [name]  ZOX:[id number] DOB: 06-03-1980 DOA: 12/27/2017 PCP: Patient, No Pcp Per   Brief Narrative:  [name] [name] is a 37 y.o. male with medical history significant for polysubstance abuse, type 2 diabetes mellitus, and recent admission with acute appendicitis and sepsis with MSSA bacteremia, now presenting to the emergency department for evaluation of productive cough, shortness of breath, general malaise, and aches.  Patient was admitted to an outside hospital on 12/13/2017 with sepsis secondary to acute appendicitis, underwent laparoscopic appendectomy, MSSA grew from peritoneal fluid and blood cultures, he was noted to have splenic fluid collections that were favored to represent hematoma or seroma rather than abscess, was seen by ID and transition from empiric broad-spectrum antibiotics to cefazolin, doxycycline was later added for pneumonia, and the patient left AMA on 12/21/2017 prior to completing treatment. Blood cultures from 12/14/17 remained negative. He had and echo during recent admit that was negative for vegetation.  Since leaving, he reports that his productive cough has gradually worsened and he has developed progressive fatigue and general malaise.  He denies chest pain.  He has some mild abdominal pain that has improved significantly from the recent hospitalization.  He denies fevers.  Denies recent alcohol or illicit substance use.  ED Course: Upon arrival to the ED, patient is found to be afebrile, saturating adequately on room air, and hypertensive.  Chemistry panel is notable for an albumin of 2.9 and CBC features a mild thrombocytosis.  Lactic acid is reassuringly normal.  UDS is negative.  Lipase is mildly elevated.  Urinalysis is unremarkable.  Blood and urine cultures were collected, 2 L of normal saline administered, and the patient was treated with vancomycin and cefepime in the ED.  Given his recent MSSA bacteremia for which he may not have completed  treatment prior to leaving AMA, and now with worsening systemic symptoms, hospitalist were asked to admit for ongoing evaluation and management.   Assessment & Plan:   Principal Problem:   HCAP (healthcare-associated pneumonia) Active Problems:   Diabetes mellitus type II, non insulin dependent (HCC)   Polysubstance abuse (HCC)   Splenic lesion   MSSA bacteremia  #1 Healthcare associated pneumonia Patient presented with productive cough, shortness of breath, fatigue and malaise.  Patient during last hospitalization was being treated for pneumonia however left AMA.  Patient currently afebrile.  Normal white count.  CT abdomen and pelvis with con consolidations in the bases to reflect either pneumonia or atelectasis.  However patient with productive cough and malaise and as such being treated as a pneumonia.  Blood cultures pending.  Sputum Gram stain and culture pending.  MRSA PCR negative.  Urine Legionella antigen negative, urine strep pneumococcus antigen negative.  Patient initially was on IV vancomycin IV cefepime and antibiotics have been narrowed down to IV Ancef.  Continue Claritin and Flonase.  TEE pending today.  Patient seen in consultation by ID and following.    2.  MSSA bacteremia From outside hospital blood and peritoneal fluid cultures from 12/13/2017 were positive for MSSA in the setting of an acute appendicitis.  Patient noted to have a 2D echo done which was negative for vegetations. It was noted that patient was seen in consultation by ID and was transitioned from empiric broad-spectrum antibiotics to cefazolin on 12/16/2017 however patient left AMA on 12/21/2017.  Repeat cultures from 12/14/2017 were negative.  Blood cultures were ordered on admission and currently pending.  IV cefepime and IV vancomycin have been discontinued.  Patient  on IV Ancef per ID recommendations.  ID recommending patient undergo a TEE.  Cardiology consulted for TEE and patient for TEE today 12/31/2017.  ID  following and appreciate input and recommendations.  3.  Type 2 diabetes mellitus Patient was on oral hypoglycemic agents at home which are currently on hold.  CBG 184 this morning.  Hemoglobin A1c 8.4. Continue sliding scale insulin.   4.  Splenic fluid collection During recent admission was noted that surgeon felt likely a seroma or hematoma rather than an abscess.  Blood cultures have been repeated during this admission and are currently pending with no growth to date.  CT abdomen and pelvis which was done on admission with improvement with splenic fluid collections.  Monitor.  5.  Polysubstance abuse UDS negative on admission.  No signs of withdrawal noted.  If patient with signs of withdrawal will need to place on the clonidine detox protocol.  Follow for now.  6.  Left upper quadrant burning pain Likely neuropathic pain from splenic fluid collections felt secondary to hematoma or seroma during his hospitalization at outside hospital 12/13/2017.  Trial of Neurontin 100 mg 3 times daily.   DVT prophylaxis: SCDs Code Status: Full Family Communication: Updated patient and mother at bedside. Disposition Plan: Home when medically improved clinically stable and when okay with ID.   Consultants:   Infectious disease: Dr.Comer  Procedures:   CT abdomen and pelvis 12/27/2017  Chest x-ray 12/27/2017  TEE pending for 12/31/2017  Antimicrobials:   IV cefepime 12/27/2017>>>> 12/28/2017  IV vancomycin 12/27/2017>>>> 12/28/2017  IV cefazolin 12/28/2017   Subjective: Patient sleeping however easily arousable.  No chest pain.  No shortness of breath.  Complaining of left upper quadrant burning pain.    Objective: Vitals:   12/30/17 0434 12/30/17 1513 12/30/17 2009 12/31/17 0603  BP: 128/87 (!) 152/101 (!) 138/101 128/80  Pulse: 79 94 97 91  Resp: 20 18 (!) 22 18  Temp: 98.3 F (36.8 C) (!) 97.4 F (36.3 C) 98 F (36.7 C) 98.5 F (36.9 C)  TempSrc:  Oral Oral Oral  SpO2: 99% 98%  100% 98%  Weight:      Height:        Intake/Output Summary (Last 24 hours) at 12/31/2017 0905 Last data filed at 12/31/2017 1610 Gross per 24 hour  Intake 636.21 ml  Output 1375 ml  Net -738.79 ml   Filed Weights   12/27/17 1811  Weight: 127 kg    Examination:  General exam: NAD Respiratory system: Lungs clear to auscultation bilaterally.  No wheezes, no crackles or rhonchi.    Cardiovascular system: Regular rate and rhythm no murmurs rubs or gallops.  No JVD.  No lower extremity edema. Gastrointestinal system: Abdomen is soft, nontender, nondistended, positive bowel sounds.  No rebound.  No guarding.   Central nervous system: Alert and oriented. No focal neurological deficits. Extremities: Symmetric 5 x 5 power. Skin: Lower extremity with chronic venous stasis changes noted.  Psychiatry: Judgement and insight appear fair. Mood & affect appropriate.     Data Reviewed: I have personally reviewed following labs and imaging studies  CBC: Recent Labs  Lab 12/27/17 1408 12/28/17 0603 12/29/17 0532 12/31/17 0606  WBC 7.3 6.1 6.1 5.9  NEUTROABS 4.6 3.6 3.8  --   HGB 12.9* 11.2* 10.6* 11.4*  HCT 39.3 34.2* 32.1* 34.5*  MCV 84.0 84.2 83.2 83.7  PLT 416* 354 326 298   Basic Metabolic Panel: Recent Labs  Lab 12/27/17 1408 12/28/17 0603 12/29/17 0532  12/31/17 0606  NA 141 140 140 141  K 4.0 4.0 3.9 4.2  CL 102 104 106 108  CO2 30 27 28 25   GLUCOSE 177* 157* 156* 208*  BUN 5* 10 11 9   CREATININE 0.61 0.71 0.72 0.65  CALCIUM 8.8* 8.5* 8.6* 8.4*  MG  --   --  1.6* 1.8   GFR: Estimated Creatinine Clearance: 178 mL/min (by C-G formula based on SCr of 0.65 mg/dL). Liver Function Tests: Recent Labs  Lab 12/27/17 1408  AST 34  ALT 29  ALKPHOS 81  BILITOT 0.3  PROT 8.5*  ALBUMIN 2.9*   Recent Labs  Lab 12/27/17 1408  LIPASE 270*   No results for input(s): AMMONIA in the last 168 hours. Coagulation Profile: No results for input(s): INR, PROTIME in the last  168 hours. Cardiac Enzymes: No results for input(s): CKTOTAL, CKMB, CKMBINDEX, TROPONINI in the last 168 hours. BNP (last 3 results) No results for input(s): PROBNP in the last 8760 hours. HbA1C: Recent Labs    12/31/17 0606  HGBA1C 8.4*   CBG: Recent Labs  Lab 12/30/17 0749 12/30/17 1204 12/30/17 1657 12/30/17 2052 12/31/17 0730  GLUCAP 115* 167* 230* 230* 184*   Lipid Profile: No results for input(s): CHOL, HDL, LDLCALC, TRIG, CHOLHDL, LDLDIRECT in the last 72 hours. Thyroid Function Tests: No results for input(s): TSH, T4TOTAL, FREET4, T3FREE, THYROIDAB in the last 72 hours. Anemia Panel: No results for input(s): VITAMINB12, FOLATE, FERRITIN, TIBC, IRON, RETICCTPCT in the last 72 hours. Sepsis Labs: Recent Labs  Lab 12/27/17 1854 12/28/17 0603  PROCALCITON  --  <0.10  LATICACIDVEN 1.80  --     Recent Results (from the past 240 hour(s))  Blood culture (routine x 2)     Status: None (Preliminary result)   Collection Time: 12/27/17  2:09 PM  Result Value Ref Range Status   Specimen Description   Final    BLOOD LEFT FOREARM Performed at [facility], [address], [place], [place] [address]    Special Requests   Final    BOTTLES DRAWN AEROBIC AND ANAEROBIC Blood Culture adequate volume Performed at [facility], [address], [place], [place] [address]    Culture   Final    NO GROWTH 3 DAYS Performed at [facility], [address], [place], [place] [address]    Report Status PENDING  Incomplete  Blood culture (routine x 2)     Status: None (Preliminary result)   Collection Time: 12/27/17  2:22 PM  Result Value Ref Range Status   Specimen Description   Final    BLOOD RIGHT FOREARM Performed at [facility], [address], [place], [place] [address]    Special Requests   Final    BOTTLES DRAWN AEROBIC AND ANAEROBIC Blood Culture adequate volume Performed at [facility],  [address] [place], [place] [address]    Culture   Final    NO GROWTH 3 DAYS Performed at [facility], [address], [place], [place] [address]    Report Status PENDING  Incomplete  Urine culture     Status: None   Collection Time: 12/27/17  7:16 PM  Result Value Ref Range Status   Specimen Description   Final    URINE, RANDOM Performed at [facility], [address], [place], [place] [address]    Special Requests   Final    NONE Performed at [facility], [address] [place], [place] [address]    Culture   Final    NO GROWTH Performed at [facility], [address] [place], [place] [address]    Report Status 12/29/2017 FINAL  Final  Culture, sputum-assessment     Status: None   Collection Time: 12/27/17  8:02 PM  Result Value Ref Range Status   Specimen Description EXPECTORATED SPUTUM  Final   Special Requests NONE  Final   Sputum evaluation   Final    Sputum specimen not acceptable for testing.  Please recollect.    NOTIFIED BNKE AT 2240 ON 12/27/17 Performed at [facility], [address] [place], [place] [address]    Report Status 12/27/2017 FINAL  Final  MRSA PCR Screening     Status: None   Collection Time: 12/27/17  8:28 PM  Result Value Ref Range Status   MRSA by PCR NEGATIVE NEGATIVE Final    Comment:        The GeneXpert MRSA Assay (FDA approved for NASAL specimens only), is one component of a comprehensive MRSA colonization surveillance program. It is not intended to diagnose MRSA infection nor to guide or monitor treatment for MRSA infections. Performed at [facility], [address] [place], [place] [address]          Radiology Studies: No results found.      Scheduled Meds: . fluticasone  2 spray Each Nare Daily  . insulin aspart  0-5 Units Subcutaneous QHS  . insulin aspart  0-9 Units Subcutaneous TID WC  . loratadine  10 mg Oral  Daily  . nicotine  21 mg Transdermal Once  . nicotine  21 mg Transdermal Q24H  . sodium chloride flush  3 mL Intravenous Q12H   Continuous Infusions: . sodium chloride    . sodium chloride Stopped (12/30/17 1733)  .  ceFAZolin (ANCEF) IV 2 g (12/31/17 0624)  . magnesium sulfate 1 - 4 g bolus IVPB 2 g (12/31/17 0847)     LOS: 3 days    Time spent: 35 minutes    Ramiro Harvestaniel Demisha Nokes, MD [facility] Pager [phone]  If 7PM-7AM, please contact night-coverage [email or web address] Password [email or web address] 12/31/2017, 9:05 AM

## 2017-12-31 NOTE — Transfer of Care (Signed)
Immediate Anesthesia Transfer of Care Note  Patient: [name] [name]  Procedure(s) Performed: TRANSESOPHAGEAL ECHOCARDIOGRAM (TEE) (N/A )  Patient Location: Endoscopy Unit  Anesthesia Type:MAC  Level of Consciousness: drowsy  Airway & Oxygen Therapy: Patient Spontanous Breathing and Patient connected to nasal cannula oxygen  Post-op Assessment: Report given to RN and Post -op Vital signs reviewed and stable  Post vital signs: Reviewed and stable  Last Vitals:  Vitals Value Taken Time  BP 168/100 12/31/2017 11:42 AM  Temp    Pulse 98 12/31/2017 11:42 AM  Resp 14 12/31/2017 11:42 AM  SpO2 100 % 12/31/2017 11:42 AM  Vitals shown include unvalidated device data.  Last Pain:  Vitals:   12/31/17 1047  TempSrc: Oral  PainSc: 0-No pain      Patients Stated Pain Goal: 0 (60/04/59 9774)  Complications: No apparent anesthesia complications

## 2017-12-31 NOTE — Progress Notes (Addendum)
  Echocardiogram Echocardiogram Transesophageal has been performed.  [name] 12/31/2017, 3:06 PM

## 2017-12-31 NOTE — Anesthesia Procedure Notes (Signed)
Procedure Name: MAC Date/Time: 12/31/2017 11:20 AM Performed by: Candis Shine, CRNA Pre-anesthesia Checklist: Patient identified, Emergency Drugs available, Suction available and Patient being monitored Patient Re-evaluated:Patient Re-evaluated prior to induction Oxygen Delivery Method: Nasal cannula Dental Injury: Teeth and Oropharynx as per pre-operative assessment

## 2017-12-31 NOTE — Care Management Note (Signed)
Case Management Note  Patient Details  Name: [name] [name] MRN: [id number] Date of Birth: 1981/05/05  Subjective/Objective:       HOSPITAL DAY 3            Discharge readiness is indicated by patient meeting Recovery Milestones, including ALL of the following: ? Hemodynamic stability bp-168/100 ? Tachypnea absent heart rate=101 ? Hypoxemia absent  Saturation-100% Room air ? Afebrile, or temperature acceptable for next level of care temp 98.6 ? Oxygen absent or at baseline need no o2 ? Mental status at baseline wnl ? Antibiotic regimen acceptable for next level of care= IV Ancef 2gms q 8 hours ? Ambulatory ? Oral hydration, medications, and diet- iv ns at kvo/npo/ ?    Action/Plan:  TEE done on 08272019-no vegetation Will follow for progression and possible dc soon.  ed Discharge Date:                  Expected Discharge Plan:  Home/Self Care  In-House Referral:  Clinical Social Work  Discharge planning Services  CM Consult  Post Acute Care Choice:    Choice offered to:     DME Arranged:    DME Agency:     HH Arranged:    HH Agency:     Status of Service:  In process, will continue to follow  If discussed at Long Length of Stay Meetings, dates discussed:    Additional Comments:  Golda AcreDavis, Rhonda Lynn, RN 12/31/2017, 1:55 PM

## 2017-12-31 NOTE — Anesthesia Preprocedure Evaluation (Addendum)
Anesthesia Evaluation  Patient identified by MRN, date of birth, ID band Patient awake    Reviewed: Allergy & Precautions, NPO status , Patient's Chart, lab work & pertinent test results  Airway Mallampati: II  TM Distance: >3 FB Neck ROM: Full    Dental no notable dental hx. (+) Teeth Intact, Dental Advisory Given   Pulmonary former smoker,    Pulmonary exam normal breath sounds clear to auscultation       Cardiovascular hypertension, Normal cardiovascular exam Rhythm:Regular Rate:Normal     Neuro/Psych Anxiety negative neurological ROS     GI/Hepatic negative GI ROS, (+)     substance abuse  cocaine use, marijuana use and methamphetamine use,   Endo/Other  diabetes, Type 2, Insulin Dependent  Renal/GU negative Renal ROS  negative genitourinary   Musculoskeletal negative musculoskeletal ROS (+)   Abdominal   Peds  Hematology negative hematology ROS (+)   Anesthesia Other Findings MSSA bacteremia  Reproductive/Obstetrics                            Anesthesia Physical Anesthesia Plan  ASA: III  Anesthesia Plan: MAC   Post-op Pain Management:    Induction: Intravenous  PONV Risk Score and Plan: 1 and Propofol infusion, Midazolam and Treatment may vary due to age or medical condition  Airway Management Planned: Natural Airway and Simple Face Mask  Additional Equipment:   Intra-op Plan:   Post-operative Plan:   Informed Consent: I have reviewed the patients History and Physical, chart, labs and discussed the procedure including the risks, benefits and alternatives for the proposed anesthesia with the patient or authorized representative who has indicated his/her understanding and acceptance.   Dental advisory given  Plan Discussed with: CRNA  Anesthesia Plan Comments:        Anesthesia Quick Evaluation

## 2017-12-31 NOTE — Anesthesia Postprocedure Evaluation (Signed)
Anesthesia Post Note  Patient: [name] [name]  Procedure(s) Performed: TRANSESOPHAGEAL ECHOCARDIOGRAM (TEE) (N/A )     Patient location during evaluation: PACU Anesthesia Type: MAC Level of consciousness: awake and alert Pain management: pain level controlled Vital Signs Assessment: post-procedure vital signs reviewed and stable Respiratory status: spontaneous breathing, nonlabored ventilation, respiratory function stable and patient connected to nasal cannula oxygen Cardiovascular status: stable and blood pressure returned to baseline Postop Assessment: no apparent nausea or vomiting Anesthetic complications: no    Last Vitals:  Vitals:   12/31/17 1150 12/31/17 1200  BP: (!) 147/71   Pulse: 99 87  Resp: 13 14  Temp:    SpO2: 100% 100%    Last Pain:  Vitals:   12/31/17 1200  TempSrc:   PainSc: 0-No pain                 Sangeeta Youse L Yeslin Delio

## 2017-12-31 NOTE — Progress Notes (Signed)
    Transesophageal Echocardiogram Note  [name] [name] [id number] 05/10/1980  Procedure: Transesophageal Echocardiogram Indications: Bacteremia  Procedure Details Consent: Obtained Time Out: Verified patient identification, verified procedure, site/side was marked, verified correct patient position, special equipment/implants available, Radiology Safety Procedures followed,  medications/allergies/relevent history reviewed, required imaging and test results available.  Performed  Medications:  Pt sedated by anesthesia with versed 2 mg and diprovan 250 mg IV total.  Normal LV function; no vegetations; full report to follow.   Complications: No apparent complications Patient did tolerate procedure well.  Olga MillersBrian Makaylynn Bonillas, MD

## 2018-01-01 DIAGNOSIS — F1910 Other psychoactive substance abuse, uncomplicated: Secondary | ICD-10-CM

## 2018-01-01 DIAGNOSIS — Z9889 Other specified postprocedural states: Secondary | ICD-10-CM

## 2018-01-01 DIAGNOSIS — E119 Type 2 diabetes mellitus without complications: Secondary | ICD-10-CM

## 2018-01-01 LAB — CULTURE, BLOOD (ROUTINE X 2)
Culture: NO GROWTH
Culture: NO GROWTH
Special Requests: ADEQUATE
Special Requests: ADEQUATE

## 2018-01-01 LAB — GLUCOSE, CAPILLARY: GLUCOSE-CAPILLARY: 136 mg/dL — AB (ref 70–99)

## 2018-01-01 MED ORDER — SODIUM CHLORIDE 0.9 % IV BOLUS
500.0000 mL | Freq: Once | INTRAVENOUS | Status: AC
  Administered 2018-01-01: 500 mL via INTRAVENOUS

## 2018-01-01 MED ORDER — METOPROLOL TARTRATE 5 MG/5ML IV SOLN: 5.0000 mg | Freq: Once | INTRAVENOUS | Status: DC

## 2018-01-01 MED ORDER — LINEZOLID 600 MG PO TABS
600.0000 mg | ORAL_TABLET | Freq: Two times a day (BID) | ORAL | Status: DC
  Administered 2018-01-01: 600 mg via ORAL
  Filled 2018-01-01: qty 1

## 2018-01-01 MED ORDER — LINEZOLID 600 MG PO TABS: 600.0000 mg | ORAL_TABLET | Freq: Two times a day (BID) | ORAL | 0 refills | Status: DC

## 2018-01-01 MED FILL — LINEZOLID 600 MG TAB: 600 | 1 days supply | Qty: 3 | Fill #0

## 2018-01-01 NOTE — Progress Notes (Signed)
Pharmacy - Brief Note  Per ID, plan for 2 more days of antibiotic, plan for linezolid with 1st dose given inpatient then needing 3 tabs to complete course  - per [facility] outpatient rx - cost (without insurance) for 3 tabs is ~$15.10 (patient states he can afford this) - order placed for 1st dose to be given this am (RN aware).  Discharge order pended (just needs MD signature) - Dr Radonna RickerFeliz aware of plan/cost  Thanks, Juliette Alcideustin Zeigler, PharmD, BCPS.   Work Cell: [phone] 01/01/2018 9:55 AM

## 2018-01-01 NOTE — Progress Notes (Signed)
Pt refused bld draw and vitals.  Explained to pt importance of both.  Will discuss w/ pt again shortly.

## 2018-01-01 NOTE — Progress Notes (Signed)
Pt hypertensive and tachycardic.  Pt states he's "like this all the time".  Checked b/p in both arms.  Notified NP on call.  Order received to bolus w/ 500cc NS and to recheck b/p manually.  Will continue to monitor pt.

## 2018-01-01 NOTE — Care Management Note (Signed)
Case Management Note  Patient Details  Name: Electa Sniffric Stepter MRN: [id number] Date of Birth: 09/20/1980  Subjective/Objective:                  Discharged to home  Action/Plan: No cm needs present  Expected Discharge Date:  01/01/18               Expected Discharge Plan:  Home/Self Care  In-House Referral:  Clinical Social Work  Discharge planning Services  CM Consult  Post Acute Care Choice:    Choice offered to:     DME Arranged:    DME Agency:     HH Arranged:    HH Agency:     Status of Service:  Completed, signed off  If discussed at [employer] of [employer] Meetings, dates discussed:    Additional Comments:  Golda AcreDavis, Rhonda Lynn, RN 01/01/2018, 11:06 AM

## 2018-01-01 NOTE — Progress Notes (Signed)
Pt AVS printed and reviewed at bedside. Importance of completing antibiotic course greatly emphasized. First dose of linezolid given prior to d/c. Pt IVs removed. No further questions. [facility] by RN. Justin Mendaudle, Fergus Throne H, RN

## 2018-01-01 NOTE — Discharge Summary (Signed)
Physician Discharge Summary  [name] [name] ZOX:[id number] DOB: Jan 10, 1981 DOA: 12/27/2017  PCP: Patient, No Pcp Per  Admit date: 12/27/2017 Discharge date: 01/01/2018  Admitted From: home Disposition:  Home  Recommendations for Outpatient Follow-up:  1. Follow up with PCP in 1-2 weeks 2. Please obtain BMP/CBC in one week 3. Recheck blood pressure and initiate antihypertensive medications if needed.   Home Health:no Equipment/Devices:none  Discharge Condition: Stable CODE STATUS:Full Diet recommendation: Heart Healthy   Brief/Interim Summary: [name] Cavinessis a 37 y.o.malewith medical history significant forpolysubstance abuse, type 2 diabetes mellitus, and recent admission with acute appendicitis and sepsis with MSSA bacteremia, now presenting to the emergency department for evaluation of productive cough, shortness of breath, general malaise, and aches. Patient was admitted to an outside hospital on 12/13/2017 with sepsis secondary to acute appendicitis, underwent laparoscopic appendectomy, MSSA grew from peritoneal fluid and blood cultures, he was noted to have splenic fluid collections that were favored to represent hematoma or seroma rather than abscess, was seen by ID and transition from empiric broad-spectrum antibiotics to cefazolin, doxycycline was later added for pneumonia, and the patient left AMA on 12/21/2017 prior to completing treatment. Blood cultures from 12/14/17 remained negative. He had and echo during recent admit that was negative for vegetation.Since leaving, he reports that his productive cough has gradually worsened and he has developed progressive fatigue and general malaise. He denies chest pain. He has some mild abdominal pain that has improved significantly from the recent hospitalization. He denies fevers. Denies recent alcohol or illicit substance use.  Discharge Diagnoses:  Principal Problem:   HCAP (healthcare-associated pneumonia) Active Problems:    Diabetes mellitus type II, non insulin dependent (HCC)   Polysubstance abuse (HCC)   Splenic lesion   MSSA bacteremia   Postoperative state  Healthcare associated pneumonia: CT scan of the abdomen and pelvis was done that showed consolidation of basis in the lungs, due to his productive cough and fever and he was started empirically on antibiotic culture data remain negative. Infectious disease was consulted a TEE was done that showed no vegetation. Infectious disease recommended to switch to low Nasalide and finished treatment as an outpatient.  MSSA bacteremia: Cultures tied hospital were positive for MSSA on 12/13/2017. 2D echo showed no vegetation infectious disease was consulted he remained an antibiotic throughout his hospital stay once a TEE was negative infectious disease recommended to continue oral and As an outpatient.  Diabetes mellitus type 2:  No changes made to his oral hypoglycemic agents he will continue his regimen at home.  Splenic fluid collection: Imaging was done to surgeon was consulted recommended no surgical intervention as they felt this was a seroma or hematoma rather than abscess.  Ultra data remain negative.  Polysubstance abuse: UDS negative on admission.  Elevated blood pressure without a diagnosis of hypertension: This will need to be follow-up by his primary care physician  Discharge Instructions  Discharge Instructions    Diet - low sodium heart healthy   Complete by:  As directed    Increase activity slowly   Complete by:  As directed      Allergies as of 01/01/2018      Reactions   Demerol [meperidine] Other (See Comments)   Makes him crazy   Hydrocodone Itching      Medication List    TAKE these medications   glipiZIDE 5 MG tablet Commonly known as:  GLUCOTROL Take 1 tablet (5 mg total) by mouth daily before breakfast.   linezolid 600 MG tablet  Commonly known as:  ZYVOX Take 1 tablet (600 mg total) by mouth 2 (two) times daily.    nicotine 14 mg/24hr patch Commonly known as:  NICODERM CQ - dosed in mg/24 hours Place 1 patch (14 mg total) onto the skin daily.   oxyCODONE 5 MG immediate release tablet Commonly known as:  Oxy IR/ROXICODONE Take 1 tablet (5 mg total) by mouth every 6 (six) hours as needed for moderate pain.   saccharomyces boulardii 250 MG capsule Commonly known as:  FLORASTOR Take 1 capsule (250 mg total) by mouth 2 (two) times daily.       Allergies  Allergen Reactions  . Demerol [Meperidine] Other (See Comments)    Makes him crazy  . Hydrocodone Itching    Consultations:  Infectious disease Cardiology General surgery   Procedures/Studies: Dg Chest 2 View  Result Date: 12/27/2017 CLINICAL DATA:  Chest pain. EXAM: CHEST - 2 VIEW COMPARISON:  Radiograph of December 15, 2017. FINDINGS: The heart size and mediastinal contours are within normal limits. No pneumothorax is noted. Small bilateral pleural effusions are noted. Mild bibasilar subsegmental atelectasis is noted. The visualized skeletal structures are unremarkable. IMPRESSION: Mild bibasilar subsegmental atelectasis with small pleural effusions. Electronically Signed   By: Lupita Raider, M.D.   On: 12/27/2017 14:03   Ct Abdomen Pelvis W Contrast  Result Date: 12/27/2017 CLINICAL DATA:  37 y/o  M; cough, fatigue, generalized body aches. EXAM: CT ABDOMEN AND PELVIS WITH CONTRAST TECHNIQUE: Multidetector CT imaging of the abdomen and pelvis was performed using the standard protocol following bolus administration of intravenous contrast. CONTRAST:  ISOVUE-300 IOPAMIDOL (ISOVUE-300) INJECTION 61% COMPARISON:  12/21/2017 CT abdomen and pelvis. FINDINGS: Lower chest: Increased small bilateral pleural effusions and persistent consolidations within the lung bases. Hepatobiliary: No focal liver abnormality is seen. No gallstones, gallbladder wall thickening, or biliary dilatation. Pancreas: Unremarkable. No pancreatic ductal dilatation or  surrounding inflammatory changes. Spleen: Collection along the posteromedial margin of the spleen is decreased in size measuring 6.5 x 4.4 cm (series 2, image 39). Trace residual collection along the anterior superior margin of the spleen (series 2, image 25). Adrenals/Urinary Tract: Adrenal glands are unremarkable. Kidneys are normal, without renal calculi, focal lesion, or hydronephrosis. Minimal bladder wall thickening, decreased from the prior study. Stomach/Bowel: Stomach is within normal limits. Appendectomy. No evidence of bowel wall thickening, distention, or inflammatory changes. Vascular/Lymphatic: No significant vascular findings are present. No enlarged abdominal or pelvic lymph nodes. Reproductive: Prostate is unremarkable. Other: No abdominal wall hernia or abnormality. No abdominopelvic ascites. Musculoskeletal: Stable probable hematoma in the right flank subcutaneous fat measuring up to 10 cm. No acute fracture. IMPRESSION: 1. Increased small bilateral pleural effusions and persistent small consolidations in the lung bases which may represent atelectasis or pneumonia. 2. Decreased size of perisplenic fluid collections. 3. Decreased bladder wall thickening and edema in the pelvis. 4. Stable hematoma in the right flank subcutaneous fat. Electronically Signed   By: Mitzi Hansen M.D.   On: 12/27/2017 17:32      Subjective: No complains  Discharge Exam: Vitals:   01/01/18 0238 01/01/18 0926  BP: (!) 160/100 (!) 158/104  Pulse: (!) 120 (!) 105  Resp:    Temp:    SpO2:     Vitals:   01/01/18 0120 01/01/18 0123 01/01/18 0238 01/01/18 0926  BP: (!) 152/119 (!) 174/102 (!) 160/100 (!) 158/104  Pulse: (!) 125 (!) 121 (!) 120 (!) 105  Resp:      Temp:  97.6  F (36.4 C)    TempSrc:  Oral    SpO2:  100%    Weight:      Height:        General: Pt is alert, awake, not in acute distress Cardiovascular: RRR, S1/S2 +, no rubs, no gallops Respiratory: CTA bilaterally, no  wheezing, no rhonchi Abdominal: Soft, NT, ND, bowel sounds + Extremities: no edema, no cyanosis    The results of significant diagnostics from this hospitalization (including imaging, microbiology, ancillary and laboratory) are listed below for reference.     Microbiology: Recent Results (from the past 240 hour(s))  Blood culture (routine x 2)     Status: None   Collection Time: 12/27/17  2:09 PM  Result Value Ref Range Status   Specimen Description   Final    BLOOD LEFT FOREARM Performed at [facility], [address] [place], [place] [address]    Special Requests   Final    BOTTLES DRAWN AEROBIC AND ANAEROBIC Blood Culture adequate volume Performed at [facility], [address] [place], [place] [address]    Culture   Final    NO GROWTH 5 DAYS Performed at [facility], [address] [place], [place] [address]    Report Status 01/01/2018 FINAL  Final  Blood culture (routine x 2)     Status: None   Collection Time: 12/27/17  2:22 PM  Result Value Ref Range Status   Specimen Description   Final    BLOOD RIGHT FOREARM Performed at [facility], [address] [place], [place] [address]    Special Requests   Final    BOTTLES DRAWN AEROBIC AND ANAEROBIC Blood Culture adequate volume Performed at [facility], [address] [place], [place] [address]    Culture   Final    NO GROWTH 5 DAYS Performed at [facility], [address] [place], [place] [address]    Report Status 01/01/2018 FINAL  Final  Urine culture     Status: None   Collection Time: 12/27/17  7:16 PM  Result Value Ref Range Status   Specimen Description   Final    URINE, RANDOM Performed at [facility], [address] [place], [place] [address]    Special Requests   Final    NONE Performed at [facility], [address] [place], [place] [address]    Culture   Final     NO GROWTH Performed at [facility], [address] [place], [place] [address]    Report Status 12/29/2017 FINAL  Final  Culture, sputum-assessment     Status: None   Collection Time: 12/27/17  8:02 PM  Result Value Ref Range Status   Specimen Description EXPECTORATED SPUTUM  Final   Special Requests NONE  Final   Sputum evaluation   Final    Sputum specimen not acceptable for testing.  Please recollect.    NOTIFIED BNKE AT 2240 ON 12/27/17 Performed at [facility], [address] [place], [place] [address]    Report Status 12/27/2017 FINAL  Final  MRSA PCR Screening     Status: None   Collection Time: 12/27/17  8:28 PM  Result Value Ref Range Status   MRSA by PCR NEGATIVE NEGATIVE Final    Comment:        The GeneXpert MRSA Assay (FDA approved for NASAL specimens only), is one component of a comprehensive  MRSA colonization surveillance program. It is not intended to diagnose MRSA infection nor to guide or monitor treatment for MRSA infections. Performed at [facility], [address], [place], [place] [address]      Labs: BNP (last 3 results) No results for input(s): BNP in the last 8760 hours. Basic Metabolic Panel: Recent Labs  Lab 12/27/17 1408 12/28/17 0603 12/29/17 0532 12/31/17 0606  NA 141 140 140 141  K 4.0 4.0 3.9 4.2  CL 102 104 106 108  CO2 30 27 28 25   GLUCOSE 177* 157* 156* 208*  BUN 5* 10 11 9   CREATININE 0.61 0.71 0.72 0.65  CALCIUM 8.8* 8.5* 8.6* 8.4*  MG  --   --  1.6* 1.8   Liver Function Tests: Recent Labs  Lab 12/27/17 1408  AST 34  ALT 29  ALKPHOS 81  BILITOT 0.3  PROT 8.5*  ALBUMIN 2.9*   Recent Labs  Lab 12/27/17 1408  LIPASE 270*   No results for input(s): AMMONIA in the last 168 hours. CBC: Recent Labs  Lab 12/27/17 1408 12/28/17 0603 12/29/17 0532 12/31/17 0606  WBC 7.3 6.1 6.1 5.9  NEUTROABS 4.6 3.6 3.8  --   HGB 12.9* 11.2* 10.6* 11.4*  HCT 39.3 34.2* 32.1*  34.5*  MCV 84.0 84.2 83.2 83.7  PLT 416* 354 326 298   Cardiac Enzymes: No results for input(s): CKTOTAL, CKMB, CKMBINDEX, TROPONINI in the last 168 hours. BNP: Invalid input(s): POCBNP CBG: Recent Labs  Lab 12/31/17 1109 12/31/17 1701 12/31/17 2012 12/31/17 2301 01/01/18 0730  GLUCAP 129* 183* 158* 155* 136*   D-Dimer No results for input(s): DDIMER in the last 72 hours. Hgb A1c Recent Labs    12/31/17 0606  HGBA1C 8.4*   Lipid Profile No results for input(s): CHOL, HDL, LDLCALC, TRIG, CHOLHDL, LDLDIRECT in the last 72 hours. Thyroid function studies No results for input(s): TSH, T4TOTAL, T3FREE, THYROIDAB in the last 72 hours.  Invalid input(s): FREET3 Anemia work up No results for input(s): VITAMINB12, FOLATE, FERRITIN, TIBC, IRON, RETICCTPCT in the last 72 hours. Urinalysis    Component Value Date/Time   COLORURINE YELLOW 12/27/2017 1916   APPEARANCEUR CLEAR 12/27/2017 1916   LABSPEC 1.010 12/27/2017 1916   PHURINE 7.0 12/27/2017 1916   GLUCOSEU 50 (A) 12/27/2017 1916   HGBUR NEGATIVE 12/27/2017 1916   BILIRUBINUR NEGATIVE 12/27/2017 1916   KETONESUR NEGATIVE 12/27/2017 1916   PROTEINUR NEGATIVE 12/27/2017 1916   NITRITE NEGATIVE 12/27/2017 1916   LEUKOCYTESUR NEGATIVE 12/27/2017 1916   Sepsis Labs Invalid input(s): PROCALCITONIN,  WBC,  LACTICIDVEN Microbiology Recent Results (from the past 240 hour(s))  Blood culture (routine x 2)     Status: None   Collection Time: 12/27/17  2:09 PM  Result Value Ref Range Status   Specimen Description   Final    BLOOD LEFT FOREARM Performed at [facility], [address], [place], [place] [address]    Special Requests   Final    BOTTLES DRAWN AEROBIC AND ANAEROBIC Blood Culture adequate volume Performed at [facility], [address], [place], [place] [address]    Culture   Final    NO GROWTH 5 DAYS Performed at [facility], [address] [place], [place]  [address]    Report Status 01/01/2018 FINAL  Final  Blood culture (routine x 2)     Status: None   Collection Time: 12/27/17  2:22 PM  Result Value Ref Range Status   Specimen  Description   Final    BLOOD RIGHT FOREARM Performed at [facility], [address], [place], [place] [address]    Special Requests   Final    BOTTLES DRAWN AEROBIC AND ANAEROBIC Blood Culture adequate volume Performed at [facility], [address], [place], [place] [address]    Culture   Final    NO GROWTH 5 DAYS Performed at [facility], [address], [place], [place] [address]    Report Status 01/01/2018 FINAL  Final  Urine culture     Status: None   Collection Time: 12/27/17  7:16 PM  Result Value Ref Range Status   Specimen Description   Final    URINE, RANDOM Performed at [facility], [address], [place], [place] [address]    Special Requests   Final    NONE Performed at [facility], [address], [place], [place] [address]    Culture   Final    NO GROWTH Performed at [facility], [address] [place], [place] [address]    Report Status 12/29/2017 FINAL  Final  Culture, sputum-assessment     Status: None   Collection Time: 12/27/17  8:02 PM  Result Value Ref Range Status   Specimen Description EXPECTORATED SPUTUM  Final   Special Requests NONE  Final   Sputum evaluation   Final    Sputum specimen not acceptable for testing.  Please recollect.    NOTIFIED BNKE AT 2240 ON 12/27/17 Performed at [facility], [address], [place], [place] [address]    Report Status 12/27/2017 FINAL  Final  MRSA PCR Screening     Status: None   Collection Time: 12/27/17  8:28 PM  Result Value Ref Range Status   MRSA by PCR NEGATIVE NEGATIVE Final    Comment:        The GeneXpert MRSA Assay (FDA approved for NASAL specimens only), is one component of a comprehensive MRSA  colonization surveillance program. It is not intended to diagnose MRSA infection nor to guide or monitor treatment for MRSA infections. Performed at [facility], [address] [place], [place] [address]      Time coordinating discharge: Over 35 minutes  SIGNED:   Marinda Elk, MD  [facility] 01/01/2018, 10:12 AM Pager   If 7PM-7AM, please contact night-coverage [email or web address] Password [email or web address]

## 2020-03-04 IMAGING — CT CT ABD-PELV W/ CM
2 of 4 series · 15 of 46 positions shown, 17 images · IV contrast (iopamidol)
Comparison: 12/21/2017 CT abdomen and pelvis.

CLINICAL DATA: 37 y/o  M; cough, fatigue, generalized body aches.

EXAM:
CT ABDOMEN AND PELVIS WITH CONTRAST
TECHNIQUE: Multidetector CT imaging of the abdomen and pelvis was performed
using the standard protocol following bolus administration of
intravenous contrast.
CONTRAST:  100mL [id number]-WBB IOPAMIDOL ([id number]) INJECTION 61%

[Series 2: axial st · axial · 0.89mm/px · z∈[+1038,+1538]mm · 12 of 114 slices shown, 14 images]
[im 7/114  soft-tissue]
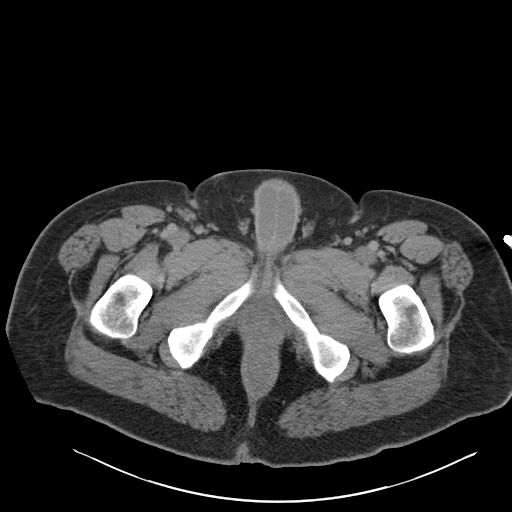
[im 7/114  bone]
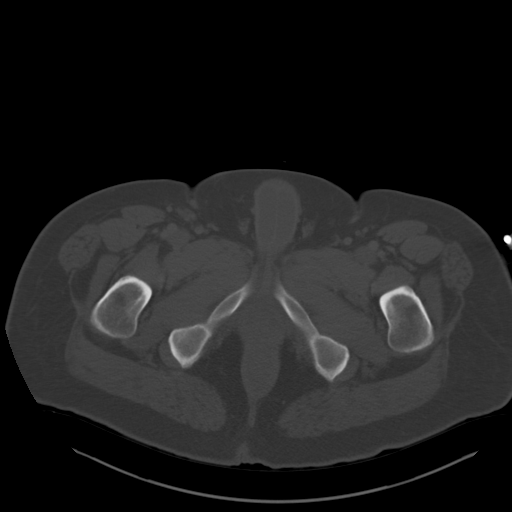
[im 20/114  soft-tissue]
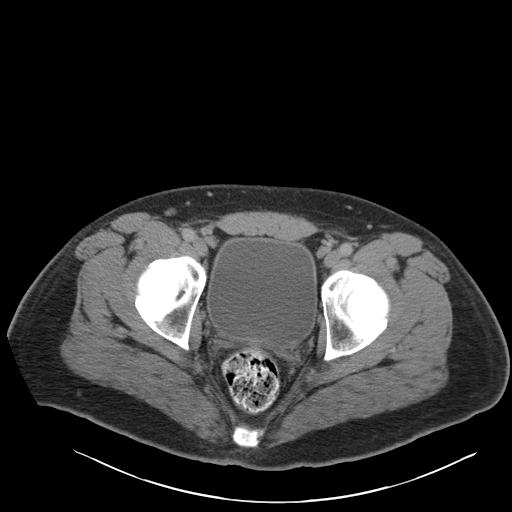
[im 27/114  soft-tissue]
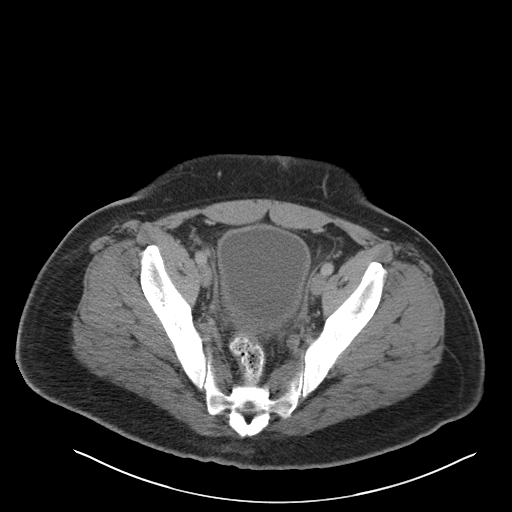
[im 34/114  soft-tissue]
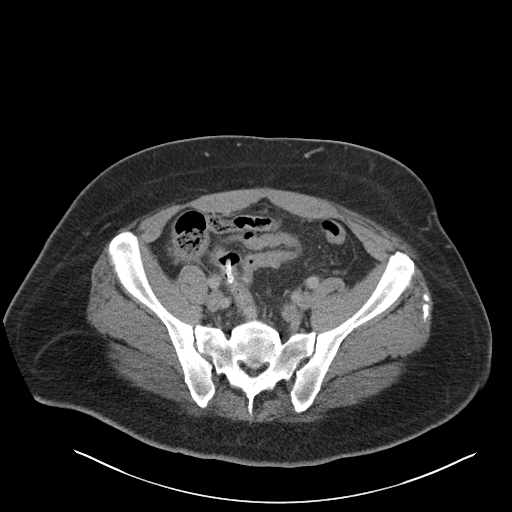
[im 47/114  soft-tissue]
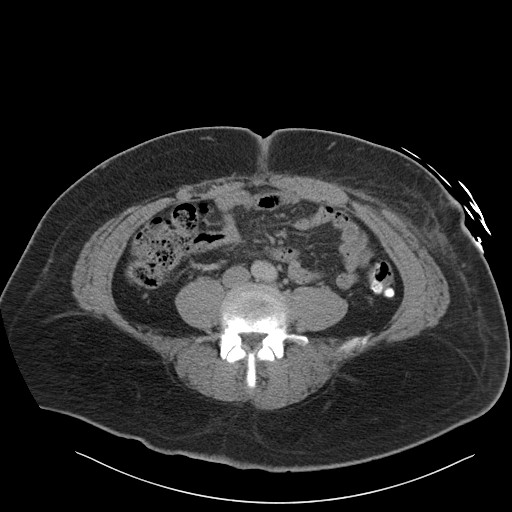
[im 54/114  soft-tissue]
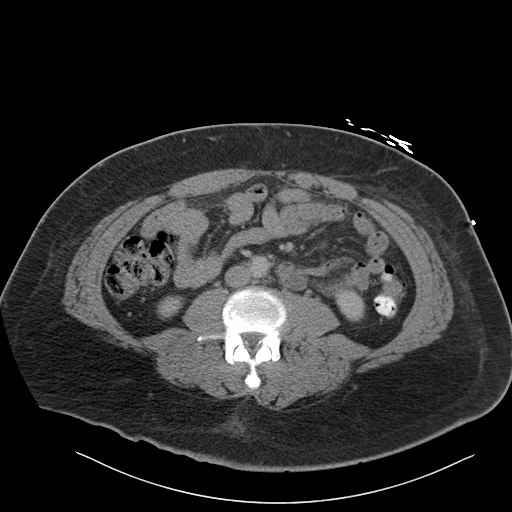
[im 60/114  soft-tissue]
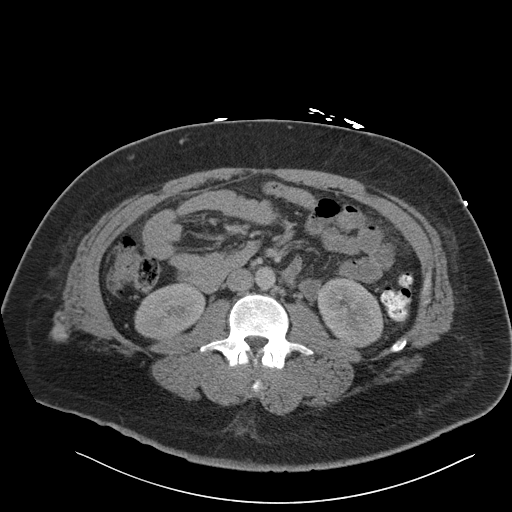
[im 74/114  soft-tissue]
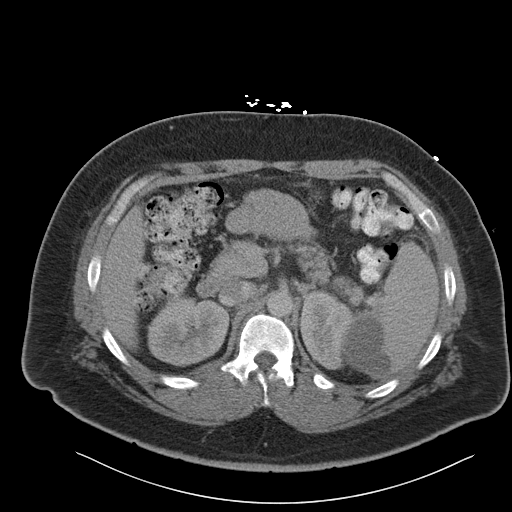
[im 80/114  soft-tissue]
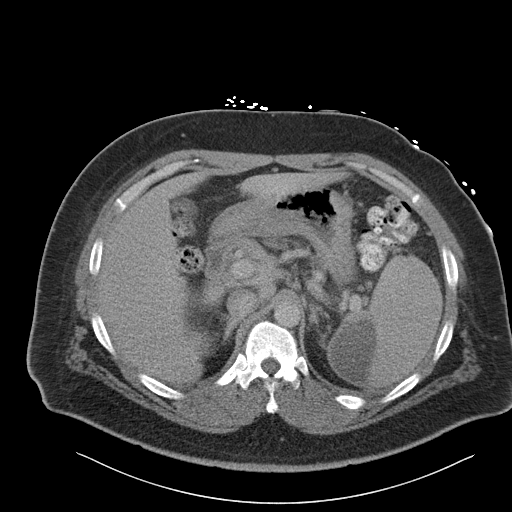
[im 80/114  bone]
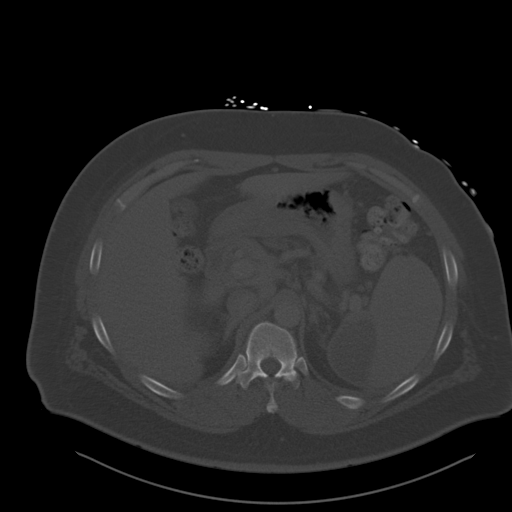
[im 87/114  soft-tissue]
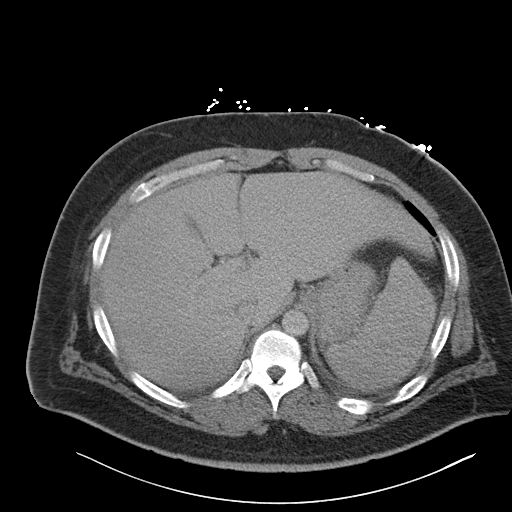
[im 100/114  soft-tissue]
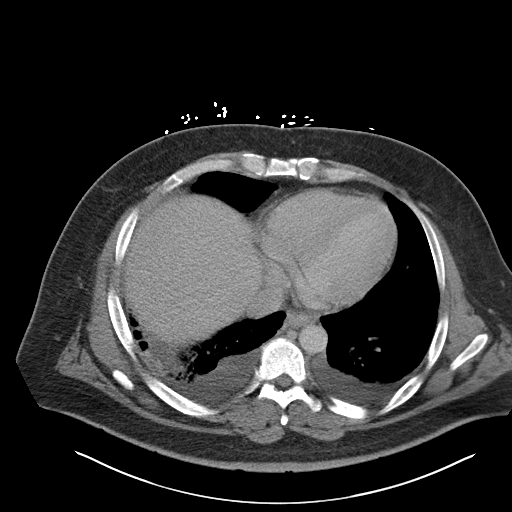
[im 107/114  soft-tissue]
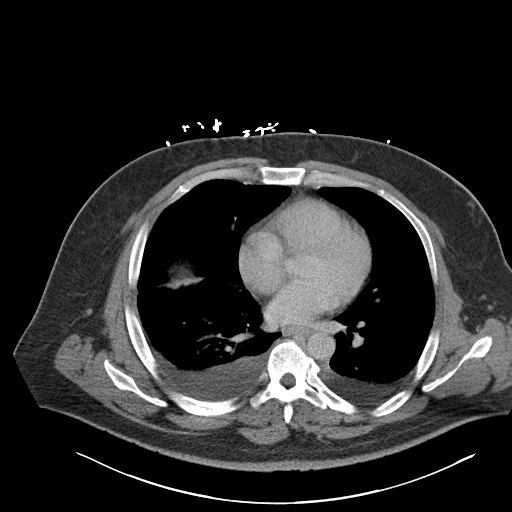

[Series 4: coronal st · coronal · 0.78mm/px · 3 of 101 slices shown]
[im 34/101  soft-tissue]
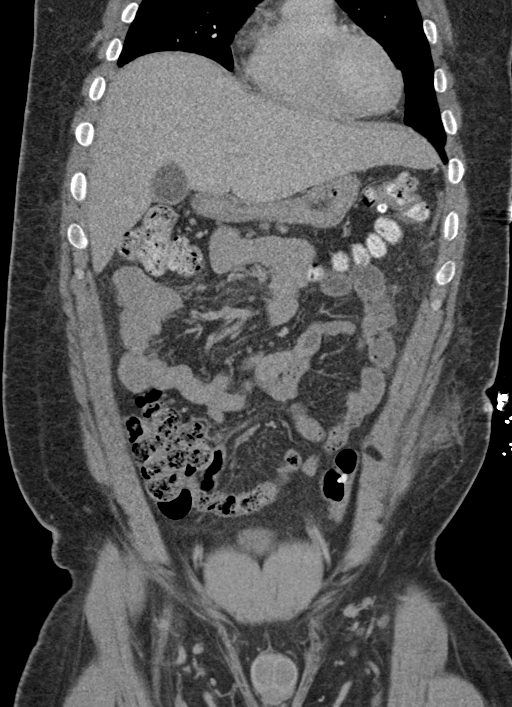
[im 45/101  soft-tissue]
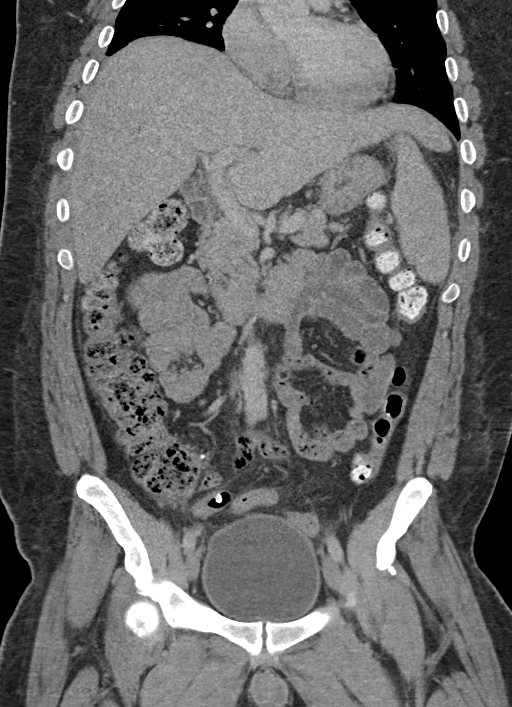
[im 56/101  soft-tissue]
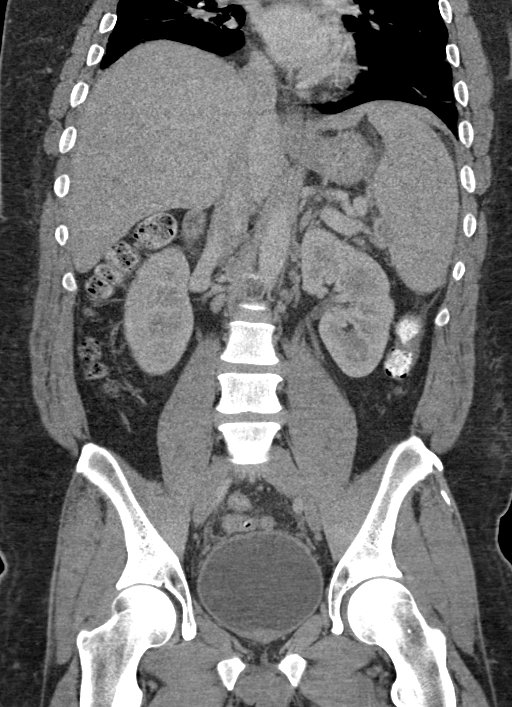

[15 of 46 positions shown; findings below may reference images not displayed]

FINDINGS: Lower chest: Increased small bilateral pleural effusions and
persistent consolidations within the lung bases.

Hepatobiliary: No focal liver abnormality is seen. No gallstones,
gallbladder wall thickening, or biliary dilatation.

Pancreas: Unremarkable. No pancreatic ductal dilatation or
surrounding inflammatory changes.

Spleen: Collection along the posteromedial margin of the spleen is
decreased in size measuring 6.5 x 4.4 cm (series 2, image 39). Trace
residual collection along the anterior superior margin of the spleen
(series 2, image 25).

Adrenals/Urinary Tract: Adrenal glands are unremarkable. Kidneys are
normal, without renal calculi, focal lesion, or hydronephrosis.
Minimal bladder wall thickening, decreased from the prior study.

Stomach/Bowel: Stomach is within normal limits. Appendectomy. No
evidence of bowel wall thickening, distention, or inflammatory
changes.

Vascular/Lymphatic: No significant vascular findings are present. No
enlarged abdominal or pelvic lymph nodes.

Reproductive: Prostate is unremarkable.

Other: No abdominal wall hernia or abnormality. No abdominopelvic
ascites.

Musculoskeletal: Stable probable hematoma in the right flank
subcutaneous fat measuring up to 10 cm. No acute fracture.
IMPRESSION: 1. Increased small bilateral pleural effusions and persistent small
consolidations in the lung bases which may represent atelectasis or
pneumonia.
2. Decreased size of perisplenic fluid collections.
3. Decreased bladder wall thickening and edema in the pelvis.
4. Stable hematoma in the right flank subcutaneous fat.

By: Kifayat Bokul M.D.

## 2022-10-06 DEATH — deceased
